# Patient Record
Sex: Female | Born: 1940 | Race: White | Hispanic: No | Marital: Married | Smoking: Never smoker
Health system: Southern US, Community
[De-identification: ages and names within clinical notes are randomized; demographics above are authoritative.]

## PROBLEM LIST (undated history)

## (undated) DIAGNOSIS — R609 Edema, unspecified: Secondary | ICD-10-CM

## (undated) HISTORY — PX: TONSILLECTOMY: SUR1361

## (undated) HISTORY — PX: TOTAL HIP ARTHROPLASTY: SHX124

## (undated) HISTORY — PX: JOINT REPLACEMENT: SHX530

## (undated) HISTORY — PX: TUBAL LIGATION: SHX77

## (undated) HISTORY — DX: Edema, unspecified: R60.9

---

## 1977-09-27 HISTORY — PX: DILATION AND CURETTAGE OF UTERUS: SHX78

## 2004-07-27 ENCOUNTER — Inpatient Hospital Stay (HOSPITAL_COMMUNITY): Admission: RE | Admit: 2004-07-27 | Discharge: 2004-07-30 | Payer: Self-pay | Admitting: Orthopedic Surgery

## 2011-11-12 ENCOUNTER — Observation Stay (HOSPITAL_COMMUNITY)
Admission: EM | Admit: 2011-11-12 | Discharge: 2011-11-13 | DRG: 249 | Disposition: A | Discharge status: Home / Self Care | Payer: BC Managed Care – PPO | Attending: Orthopaedic Surgery | Admitting: Orthopaedic Surgery

## 2011-11-12 ENCOUNTER — Emergency Department (HOSPITAL_COMMUNITY): Payer: BC Managed Care – PPO

## 2011-11-12 ENCOUNTER — Encounter (HOSPITAL_COMMUNITY): Payer: Self-pay | Admitting: Neurology

## 2011-11-12 ENCOUNTER — Other Ambulatory Visit: Payer: Self-pay

## 2011-11-12 ENCOUNTER — Emergency Department (HOSPITAL_COMMUNITY): Payer: BC Managed Care – PPO | Admitting: Anesthesiology

## 2011-11-12 ENCOUNTER — Encounter (HOSPITAL_COMMUNITY)
Admission: EM | Disposition: A | Discharge status: Home / Self Care | Payer: Self-pay | Source: Home / Self Care | Attending: Emergency Medicine

## 2011-11-12 ENCOUNTER — Encounter (HOSPITAL_COMMUNITY): Payer: Self-pay | Admitting: Anesthesiology

## 2011-11-12 DIAGNOSIS — X500XXA Overexertion from strenuous movement or load, initial encounter: Secondary | ICD-10-CM | POA: Insufficient documentation

## 2011-11-12 DIAGNOSIS — T84028A Dislocation of other internal joint prosthesis, initial encounter: Secondary | ICD-10-CM

## 2011-11-12 DIAGNOSIS — Z96649 Presence of unspecified artificial hip joint: Secondary | ICD-10-CM

## 2011-11-12 DIAGNOSIS — Y93E5 Activity, floor mopping and cleaning: Secondary | ICD-10-CM | POA: Insufficient documentation

## 2011-11-12 DIAGNOSIS — M25559 Pain in unspecified hip: Secondary | ICD-10-CM | POA: Insufficient documentation

## 2011-11-12 DIAGNOSIS — Y998 Other external cause status: Secondary | ICD-10-CM | POA: Insufficient documentation

## 2011-11-12 DIAGNOSIS — Y92009 Unspecified place in unspecified non-institutional (private) residence as the place of occurrence of the external cause: Secondary | ICD-10-CM | POA: Insufficient documentation

## 2011-11-12 DIAGNOSIS — T84029A Dislocation of unspecified internal joint prosthesis, initial encounter: Principal | ICD-10-CM | POA: Insufficient documentation

## 2011-11-12 HISTORY — PX: HIP CLOSED REDUCTION: SHX983

## 2011-11-12 LAB — BASIC METABOLIC PANEL
BUN: 9 mg/dL (ref 6–23)
CO2: 27 mEq/L (ref 19–32)
Calcium: 9.8 mg/dL (ref 8.4–10.5)
Chloride: 99 mEq/L (ref 96–112)
Creatinine, Ser: 0.7 mg/dL (ref 0.50–1.10)
GFR calc Af Amer: >90 mL/min (ref >90)
GFR calc non Af Amer: 87 mL/min — ABNORMAL LOW (ref >90)
Glucose, Bld: 122 mg/dL — ABNORMAL HIGH (ref 70–99)
Potassium: 3.6 mEq/L (ref 3.5–5.1)
Sodium: 133 mEq/L — ABNORMAL LOW (ref 135–145)

## 2011-11-12 LAB — CBC
HCT: 36.8 % (ref 36.0–46.0)
Hemoglobin: 12.6 g/dL (ref 12.0–15.0)
MCH: 31.8 pg (ref 26.0–34.0)
MCHC: 34.2 g/dL (ref 30.0–36.0)
MCV: 92.9 fL (ref 78.0–100.0)
Platelets: 166 10*3/uL (ref 150–400)
RBC: 3.96 MIL/uL (ref 3.87–5.11)
RDW: 11.8 % (ref 11.5–15.5)
WBC: 7.4 10*3/uL (ref 4.0–10.5)

## 2011-11-12 SURGERY — CLOSED REDUCTION, HIP
Anesthesia: General | Site: Hip | Laterality: Right | Wound class: Clean

## 2011-11-12 MED ORDER — FENTANYL CITRATE 0.05 MG/ML IJ SOLN
INTRAMUSCULAR | Status: DC | PRN
  Administered 2011-11-12: 25 ug via INTRAVENOUS

## 2011-11-12 MED ORDER — PROPOFOL BOLUS VIA INFUSION: 0.5000 mg/kg | Freq: Once | INTRAVENOUS | Status: DC

## 2011-11-12 MED ORDER — ONDANSETRON HCL 4 MG/2ML IJ SOLN
4.0000 mg | Freq: Once | INTRAMUSCULAR | Status: AC
  Administered 2011-11-12: 4 mg via INTRAVENOUS
  Filled 2011-11-12: qty 2

## 2011-11-12 MED ORDER — PROPOFOL 10 MG/ML IV BOLUS
INTRAVENOUS | Status: DC | PRN
  Administered 2011-11-12 (×2): 50 mg via INTRAVENOUS
  Administered 2011-11-12: 150 mg via INTRAVENOUS

## 2011-11-12 MED ORDER — PROPOFOL 10 MG/ML IV EMUL
5.0000 ug/kg/min | INTRAVENOUS | Status: DC
  Filled 2011-11-12: qty 20

## 2011-11-12 MED ORDER — FENTANYL CITRATE 0.05 MG/ML IJ SOLN
INTRAMUSCULAR | Status: DC | PRN
  Administered 2011-11-12: 100 ug via INTRAVENOUS

## 2011-11-12 MED ORDER — PROPOFOL 10 MG/ML IV EMUL
INTRAVENOUS | Status: DC | PRN
  Administered 2011-11-12 (×3): 5 mL via INTRAVENOUS

## 2011-11-12 MED ORDER — FENTANYL CITRATE 0.05 MG/ML IJ SOLN
50.0000 ug | Freq: Once | INTRAMUSCULAR | Status: AC
  Administered 2011-11-12: 50 ug via INTRAVENOUS
  Filled 2011-11-12: qty 2

## 2011-11-12 MED ORDER — SUCCINYLCHOLINE CHLORIDE 20 MG/ML IJ SOLN
INTRAMUSCULAR | Status: DC | PRN
  Administered 2011-11-12 (×4): 20 mg via INTRAVENOUS

## 2011-11-12 MED ORDER — MORPHINE SULFATE 4 MG/ML IJ SOLN: 4.0000 mg | Freq: Once | INTRAMUSCULAR | Status: DC

## 2011-11-12 MED ORDER — FENTANYL CITRATE 0.05 MG/ML IJ SOLN
100.0000 ug | Freq: Once | INTRAMUSCULAR | Status: AC
  Administered 2011-11-12: 25 ug via INTRAVENOUS
  Filled 2011-11-12: qty 2

## 2011-11-12 MED ORDER — LACTATED RINGERS IV SOLN
INTRAVENOUS | Status: DC | PRN
  Administered 2011-11-12: 23:00:00 via INTRAVENOUS

## 2011-11-12 MED ORDER — KETOROLAC TROMETHAMINE 15 MG/ML IJ SOLN
15.0000 mg | Freq: Four times a day (QID) | INTRAMUSCULAR | Status: DC
  Administered 2011-11-12: 15 mg via INTRAVENOUS
  Filled 2011-11-12 (×3): qty 1

## 2011-11-12 SURGICAL SUPPLY — 1 items: IMMOBILIZER KNEE 22 UNIV (SOFTGOODS) ×1 IMPLANT

## 2011-11-12 NOTE — Transfer of Care (Signed)
Immediate Anesthesia Transfer of Care Note  Patient: Patricia [name]  Procedure(s) Performed: Procedure(s) (LRB): CLOSED REDUCTION HIP (Right)  Patient Location: PACU  Anesthesia Type: General  Level of Consciousness: awake  Airway & Oxygen Therapy: Patient Spontanous Breathing  Post-op Assessment: Report given to PACU RN and Post -op Vital signs reviewed and stable  Post vital signs: Reviewed and stable  Complications: No apparent anesthesia complications

## 2011-11-12 NOTE — ED Notes (Signed)
Pt transported to OR, report given to OR.  Transported with ortho MD.  Husband took belongings with.

## 2011-11-12 NOTE — Op Note (Signed)
PATIENT ID:      Patricia [name]  MRN:     [id number] DOB/AGE:    1941-06-04 / 71 y.o.       OPERATIVE REPORT    DATE OF PROCEDURE:  11/12/2011       PREOPERATIVE DIAGNOSIS:   Dislocated right hip prosthesis                                                       There is no height on file to calculate BMI.     POSTOPERATIVE DIAGNOSIS:   Dislocated Right Hip Prosthesis                                                                     There is no height on file to calculate BMI.     PROCEDURE:  Procedure(s): CLOSED REDUCTION HIP     SURGEON: Kashon Kraynak [name]    ASSISTANT:   Jacqualine Code, PA-C   (Present and scrubbed throughout the case, critical for assistance with exposure, retraction, instrumentation, and closure.)          ANESTHESIA: General      DRAINS: none :      TOURNIQUET TIME: * No tourniquets in log *    COMPLICATIONS:  None   CONDITION:  stable  PROCEDURE IN DETAIL: [id number]   Patricia [name] 11/12/2011, 11:30 PM

## 2011-11-12 NOTE — Anesthesia Postprocedure Evaluation (Signed)
  Anesthesia Post-op Note  Patient: Patricia [name]  Procedure(s) Performed: Procedure(s) (LRB): CLOSED REDUCTION HIP (Right)  Patient Location: PACU  Anesthesia Type: General  Level of Consciousness: awake  Airway and Oxygen Therapy: Patient Spontanous Breathing  Post-op Pain: none  Post-op Assessment: Post-op Vital signs reviewed, Patient's Cardiovascular Status Stable, Respiratory Function Stable, Patent Airway, No signs of Nausea or vomiting and Pain level controlled  Post-op Vital Signs: stable  Complications: No apparent anesthesia complications

## 2011-11-12 NOTE — ED Notes (Signed)
Xray, phlebotomy at bedside.  12 lead EKG being done as well.

## 2011-11-12 NOTE — ED Notes (Signed)
Xray at bedside for portable right hip xray

## 2011-11-12 NOTE — ED Notes (Signed)
[id number] Ready 

## 2011-11-12 NOTE — ED Notes (Signed)
MD at bedside. 

## 2011-11-12 NOTE — Op Note (Deleted)
ERROR

## 2011-11-12 NOTE — ED Provider Notes (Signed)
History     CSN: [id number]  Arrival date & time 11/12/11  7829   First MD Initiated Contact with Patient 11/12/11 1852      Chief Complaint  Patient presents with  . Hip Pain    (Consider location/radiation/quality/duration/timing/severity/associated sxs/prior treatment) Patient is a 71 y.o. female presenting with hip pain.  Hip Pain Associated symptoms include arthralgias. Pertinent negatives include no numbness, rash or weakness.   History is obtained from the patient. She is status post bilateral total hip arthroplasty, which she believes was performed over 10 years ago. Her orthopedist is Dr. Thurston Hole. She states that she has done well with her replacements and has not had any issues. Today, she was at home cleaning around 1700 when she bent over and felt her right hip "pop out of place." She has noticed a deformity to the area. Denies any distal numbness or weakness. States her pain worsens when she attempts to move.  History reviewed. No pertinent past medical history.  Past Surgical History  Procedure Date  . Total hip arthroplasty     No family history on file.  History  Substance Use Topics  . Smoking status: Never Smoker   . Smokeless tobacco: Not on file  . Alcohol Use: Yes    OB History    Grav Para Term Preterm Abortions TAB SAB Ect Mult Living                  Review of Systems  Constitutional: Negative.   Musculoskeletal: Positive for arthralgias and gait problem.  Skin: Negative for color change and rash.  Neurological: Negative for weakness and numbness.    Allergies  Review of patient's allergies indicates no known allergies.  Home Medications   Current Outpatient Rx  Name Route Sig Dispense Refill  . IBUPROFEN 200 MG PO TABS Oral Take 600 mg by mouth every 6 (six) hours as needed. For fever      BP 165/86  Pulse 88  Temp(Src) 98.4 F (36.9 C) (Oral)  Resp 16  SpO2 100%  Physical Exam  Vitals reviewed. Constitutional: She is  oriented to person, place, and time. She appears well-developed and well-nourished. No distress.  HENT:  Head: Normocephalic and atraumatic.  Neck: Normal range of motion.  Cardiovascular: Normal rate.   Pulmonary/Chest: Effort normal.  Musculoskeletal:       Right hip: She exhibits decreased range of motion, bony tenderness and deformity.       Obvious deformity at right hip. The leg is shortened and rotated. She has limited movement in the leg. The extremity is neurovascularly intact with sensory intact to light touch. Dorsalis pedis and posterior tibial pulses intact. Capillary refill less than 2 seconds.  Neurological: She is alert and oriented to person, place, and time.  Skin: Skin is warm and dry. She is not diaphoretic.  Psychiatric: She has a normal mood and affect.    ED Course  Procedures (including critical care time)  Labs Reviewed - No data to display Dg Hip Complete Right  11/12/2011  *RADIOLOGY REPORT*  Clinical Data: Status post hip arthroplasty.  Question dislocated hip.  RIGHT HIP - COMPLETE 2+ VIEW  Comparison: None.  Findings: Superior and posterior dislocation of the femoral component of the right hip arthroplasty.  The left hip arthroplasty is located.  Mild osteopenia.  No periprosthetic fracture.  IMPRESSION: Status post right hip arthroplasty with superior posterior dislocation of the femoral component.  Original Report Authenticated By: Consuello Bossier, M.D.  I personally reviewed the pt's films.   1. Dislocation of hip prosthesis       MDM  Discussed with Dr. Weldon Inches. Hip is superiorly posteriorly dislocated on plain films. Dr. Weldon Inches discussed with Dr. Cleophas Dunker, who came in to re-locate the hip under procedural sedation. She tolerated this well but the hip was unsuccessfully reduced. She will be taken to the OR for re-location of the prosthesis.        Grant Fontana, [place] 11/12/11 2236

## 2011-11-12 NOTE — Anesthesia Preprocedure Evaluation (Addendum)
Anesthesia Evaluation  Patient identified by MRN, date of birth, ID band Patient awake    Reviewed: Allergy & Precautions, H&P , NPO status , Patient's Chart, lab work & pertinent test results  Airway Mallampati: I TM Distance: >3 FB Neck ROM: Full    Dental   Pulmonary    Pulmonary exam normal       Cardiovascular     Neuro/Psych    GI/Hepatic   Endo/Other    Renal/GU      Musculoskeletal   Abdominal   Peds  Hematology   Anesthesia Other Findings   Reproductive/Obstetrics                           Anesthesia Physical Anesthesia Plan  ASA: I and Emergent  Anesthesia Plan: General   Post-op Pain Management:    Induction: Intravenous  Airway Management Planned: Mask  Additional Equipment:   Intra-op Plan:   Post-operative Plan: Extubation in OR  Informed Consent: I have reviewed the patients History and Physical, chart, labs and discussed the procedure including the risks, benefits and alternatives for the proposed anesthesia with the patient or authorized representative who has indicated his/her understanding and acceptance.     Plan Discussed with: CRNA and Surgeon  Anesthesia Plan Comments:        Anesthesia Quick Evaluation

## 2011-11-12 NOTE — H&P (Signed)
Patricia [name] MRN:  [id number] DOB/SEX:  Apr 10, 1941/female     HISTORY AND PHYSICAL   CHIEF COMPLAINT:  Painful right Hip  HISTORY: Patricia [name] a 71 y.o. female with moderate aching, sharp and throbbing groin right hip pain for last several hours..  Symptoms include inability to walk, groin pain and trouble weight bearing.  She had been dusting earlier today when she bent over and the 71 y/o Right THR dislocated.  She was unable to ambulate and was brought to the emergency room at [facility] emergency room where x-rays confirmed a dislocation of the right hip.  Attempt at a closed reduction failed to restore an anatomical position.  She is now admitted for closed reduction in the operating room.  PAST MEDICAL HISTORY:   D&C, and bilateral Total Hip Replacement with subsequent dislocation of right hip immediately after the surgery 16 years ago  Past Surgical History  Procedure Date  . Total hip arthroplasty      MEDICATIONS:  none  ALLERGIES:  No Known Allergies  REVIEW OF SYSTEMS:  A comprehensive review of systems was negative.  FAMILY HISTORY:  No family history on file.  SOCIAL HISTORY:   History  Substance Use Topics  . Smoking status: Never Smoker   . Smokeless tobacco: Not on file  . Alcohol Use: Yes     EXAMINATION:  Vital signs in last 24 hours: Temp:  [98.4 F (36.9 C)] 98.4 F (36.9 C) (02/15 1844) Pulse Rate:  [62-88] 77  (02/15 2200) Resp:  [12-30] 18  (02/15 2200) BP: (136-180)/(67-107) 159/80 mmHg (02/15 2200) SpO2:  [98 %-100 %] 100 % (02/15 2200) Weight:  [74.844 kg (165 lb)] 74.844 kg (165 lb) (02/15 2108)  BP 157/90  Pulse 82  Temp(Src) 98.4 F (36.9 C) (Oral)  Resp 18  Wt 74.844 kg (165 lb)  SpO2 98%  General Appearance:    Alert, cooperative, no distress, appears stated age  Head:    Normocephalic, without obvious abnormality, atraumatic  Eyes:    PERRL, conjunctiva/corneas clear, EOM's intact         Ears:    Normal TM's and  external ear canals, both ears  Nose:   Nares normal, septum midline, mucosa normal, no drainage    or sinus tenderness  Throat:   Lips, mucosa, and tongue normal; teeth and gums normal  Neck:   Supple, symmetrical, trachea midline, no carotid   bruit      Lungs:     Clear to auscultation bilaterally, respirations unlabored  Chest wall:    No tenderness or deformity  Heart:    Regular rate and rhythm, S1 and S2 normal, no murmur, rub   or gallop  Abdomen:     Soft, non-tender, bowel sounds active all four quadrants,    no masses, no organomegaly        Extremities:   Sensation intact.  Painful ROM  Pulses:   2+ and symmetric all extremities  Skin:   Skin color, texture, turgor normal, no rashes or lesions     Neurologic:   CNII-XII intact. Normal strength, sensation          Assessment/Plan: Dislocation , right Total Hip Replacement  Plan for Closed reduction Right total hip replacement   The procedure,  risks, and benefits of total hip arthroplasty were presented and reviewed. The risks including but not limited to aseptic loosening, infection, blood clots, vascular injury, stiffness problems, as well as fracture complications among others were discussed.  The patient acknowledged the explanation, agreed to proceed with the plan.  Patricia [name] 11/12/2011, 10:11 PM

## 2011-11-12 NOTE — ED Provider Notes (Signed)
Medical screening examination/treatment/procedure(s) were conducted as a shared visit with non-physician practitioner(s) and myself.  I personally evaluated the patient during the encounter  Tiandre Teall P Dorothy Polhemus, MD 11/12/11 2359 

## 2011-11-12 NOTE — Anesthesia Procedure Notes (Signed)
Procedure Name: LMA Insertion Date/Time: 11/12/2011 11:03 PM Performed by: Alanda Amass Pre-anesthesia Checklist: Patient identified, Timeout performed, Emergency Drugs available, Suction available and Patient being monitored Patient Re-evaluated:Patient Re-evaluated prior to inductionOxygen Delivery Method: Circle System Utilized Preoxygenation: Pre-oxygenation with 100% oxygen Intubation Type: IV induction Ventilation: Mask ventilation without difficulty LMA: LMA inserted LMA Size: 4.0 Number of attempts: 1 Tube secured with: Tape Dental Injury: Teeth and Oropharynx as per pre-operative assessment

## 2011-11-12 NOTE — ED Notes (Signed)
Per ems- pt cleaning today, bent over and felt a pop in her right hip. Hx of bilateral hip placement. Pt given 8 mg morphine. 20 gauge in r. Forearm. Vitals 120/82, 78, 97% RA. Pain 3/10 when not moving. Shortening and rotation noted in right leg. Alert and oriented.

## 2011-11-12 NOTE — ED Provider Notes (Addendum)
Medical screening examination/treatment/procedure(s) were conducted as a shared visit with non-physician practitioner(s) and myself.  I personally evaluated the patient during the encounter 71 year old, female, with bilateral prosthetic hips, presents to emergency department with right hip pain.  She was bending over to dust and her hip rolled, and she felt immediate pain.  She presents to emergency department with a shortened and internally rotated right hip.  Her last meal was 11:00 this morning.  She has no other complaints.  X-ray shows prosthetic hip dislocation.  We will consult her orthopedist for relocation  Nicholes Stairs, MD 11/12/11 2046  I spoke with the orthopedist.  Dr. Cleophas Dunker.  He will come in to reduce the hip.  We will attempt to do this with procedural sedation using propofol and fentanyl.    Nicholes Stairs, MD 11/12/11 2103  Perform procedural sedation with Dr. Cleophas Dunker is sedation went well.  The patient tolerated it without any complications.  However, they were not able to reduce her right hip.  Therefore, the patient will be taken to the operating room for reduction of her prosthetic right hip.  preop ecg ED ECG REPORT   Date: 11/12/2011  EKG Time: 10:34 PM  Rate: 82  Rhythm: normal sinus rhythm,    Axis: nl  Intervals:none  ST&T Change: none  Narrative Interpretation: nsr            Nicholes Stairs, MD 11/12/11 2236

## 2011-11-12 NOTE — ED Notes (Signed)
Pt sedated and monitored by Dr. Weldon Inches and hip maneuvered by Dr. Cleophas Dunker.  Consent signed before procedure.  Vital signs were stable during sedation.  Pt now awake, alert and oriented post-procedure.  Waiting for post-reduction x-ray.

## 2011-11-13 MED ORDER — HYDROMORPHONE HCL PF 1 MG/ML IJ SOLN: 0.5000 mg | INTRAMUSCULAR | Status: DC | PRN

## 2011-11-13 MED ORDER — METHOCARBAMOL 100 MG/ML IJ SOLN
500.0000 mg | Freq: Four times a day (QID) | INTRAVENOUS | Status: DC | PRN
  Filled 2011-11-13: qty 5

## 2011-11-13 MED ORDER — HYDROMORPHONE HCL PF 1 MG/ML IJ SOLN: 0.2500 mg | INTRAMUSCULAR | Status: DC | PRN

## 2011-11-13 MED ORDER — MENTHOL 3 MG MT LOZG: 1.0000 | LOZENGE | OROMUCOSAL | Status: DC | PRN

## 2011-11-13 MED ORDER — METOCLOPRAMIDE HCL 5 MG/ML IJ SOLN
5.0000 mg | Freq: Three times a day (TID) | INTRAMUSCULAR | Status: DC | PRN
  Filled 2011-11-13: qty 2

## 2011-11-13 MED ORDER — SODIUM CHLORIDE 0.9 % IV SOLN: INTRAVENOUS | Status: DC

## 2011-11-13 MED ORDER — LORAZEPAM 2 MG/ML IJ SOLN: 1.0000 mg | Freq: Once | INTRAMUSCULAR | Status: DC | PRN

## 2011-11-13 MED ORDER — ONDANSETRON HCL 4 MG PO TABS: 4.0000 mg | ORAL_TABLET | Freq: Four times a day (QID) | ORAL | Status: DC | PRN

## 2011-11-13 MED ORDER — METHOCARBAMOL 500 MG PO TABS: 500.0000 mg | ORAL_TABLET | Freq: Four times a day (QID) | ORAL | Status: DC | PRN

## 2011-11-13 MED ORDER — ONDANSETRON HCL 4 MG/2ML IJ SOLN: 4.0000 mg | Freq: Four times a day (QID) | INTRAMUSCULAR | Status: DC | PRN

## 2011-11-13 MED ORDER — METOCLOPRAMIDE HCL 10 MG PO TABS: 5.0000 mg | ORAL_TABLET | Freq: Three times a day (TID) | ORAL | Status: DC | PRN

## 2011-11-13 MED ORDER — PHENOL 1.4 % MT LIQD: 1.0000 | OROMUCOSAL | Status: DC | PRN

## 2011-11-13 MED ORDER — OXYCODONE HCL 5 MG PO TABS: 5.0000 mg | ORAL_TABLET | ORAL | Status: DC | PRN

## 2011-11-13 MED ORDER — ALUM & MAG HYDROXIDE-SIMETH 200-200-20 MG/5ML PO SUSP: 30.0000 mL | ORAL | Status: DC | PRN

## 2011-11-13 MED ORDER — PROMETHAZINE HCL 25 MG/ML IJ SOLN: 6.2500 mg | INTRAMUSCULAR | Status: DC | PRN

## 2011-11-13 NOTE — Discharge Summary (Signed)
PATIENT ID:      Patricia [name]  MRN:     [id number] DOB/AGE:    [birth year] / 69 y.o.     DISCHARGE SUMMARY  ADMISSION DATE:    11/12/2011 DISCHARGE DATE:   11/13/2011   ADMISSION DIAGNOSIS: Dislocation of hip prosthesis [996.42, V43.64] cp  (Dislocated right hip prosthesis)  DISCHARGE DIAGNOSIS:  Dislocated right hip prosthesis    ADDITIONAL DIAGNOSIS: Principal Problem:  *Failed total hip arthroplasty with dislocation  History reviewed. No pertinent past medical history.  PROCEDURE: Procedure(s): CLOSED REDUCTION Right  HIP on 11/12/2011 - 11/13/2011  CONSULTS:   none  HISTORY: Patricia [name] a 71 y.o. female with moderate aching, sharp and throbbing groin right hip pain for last several hours.. Symptoms include inability to walk, groin pain and trouble weight bearing. She had been dusting earlier today when she bent over and the 71 y/o Right THR dislocated. She was unable to ambulate and was brought to the emergency room at [facility] emergency room where x-rays confirmed a dislocation of the right hip. Attempt at a closed reduction failed to restore an anatomical position. She is now admitted for closed reduction in the operating room.   HOSPITAL COURSE:  Patricia [name] is a 71 y.o. admitted on 11/12/2011 and found to have a diagnosis of Dislocated right hip prosthesis.  After appropriate laboratory studies were obtained  they were taken to the operating room on 11/12/2011 - 11/13/2011 and underwent Procedure(s): CLOSED REDUCTION RIGHT TOTAL HIP.   They were given perioperative antibiotics:  Anti-infectives    None    . Blood products given:none  Had an unremarkable hospital course.  Has ambulated to bathroom  Without problems. The remainder of the hospital course was dedicated to ambulation and strengthening.   The patient was discharged on 1 Day Post-Op in  Good condition.   DIAGNOSTIC STUDIES: Recent vital signs: Patient Vitals for the past 24 hrs:  BP Temp Temp src Pulse  Resp SpO2 Weight  11/13/11 0700 123/71 mmHg 98 F (36.7 C) - 67  16  91 % -  11/13/11 0015 149/77 mmHg 98.3 F (36.8 C) - 83  16  95 % -  11/12/11 2351 130/64 mmHg - - 85  16  95 % -  11/12/11 2345 - - - 87  23  95 % -  11/12/11 2337 - - - 93  22  97 % -  11/12/11 2336 144/68 mmHg - - - - - -  11/12/11 2334 - 97.7 F (36.5 C) - 84  18  100 % -  11/12/11 2215 157/90 mmHg - - 82  18  98 % -  11/12/11 2200 159/80 mmHg - - 77  18  100 % -  11/12/11 2145 169/85 mmHg - - 69  19  99 % -  11/12/11 2143 172/85 mmHg - - 73  30  99 % -  11/12/11 2139 136/107 mmHg - - 64  19  100 % -  11/12/11 2132 145/67 mmHg - - 62  12  100 % -  11/12/11 2108 - - - 76  16  99 % 74.844 kg (165 lb)  11/12/11 2107 150/72 mmHg - - 69  13  99 % -  11/12/11 1930 163/71 mmHg - - 70  - 98 % -  11/12/11 1850 165/86 mmHg - - - - - -  11/12/11 1844 180/88 mmHg 98.4 F (36.9 C) Oral 88  16  100 % -  Recent laboratory studies:  [facility] 11/12/11 2220  WBC 7.4  HGB 12.6  HCT 36.8  PLT 166    Basename 11/12/11 2220  NA 133*  K 3.6  CL 99  CO2 27  BUN 9  CREATININE 0.70  GLUCOSE 122*  CALCIUM 9.8   No results found for this basename: INR, PROTIME     Recent Radiographic Studies :  Dg Hip Complete Right  11/13/2011  *RADIOLOGY REPORT*  Clinical Data: Right hip dislocation  RIGHT HIP - COMPLETE 2+ VIEW  Comparison: 11/12/2011  Findings: Two intraoperative fluoroscopic images demonstrate interval reduction of the right hip dislocation.  No appreciable evidence for hardware complication or periprosthetic fracture no the inferior aspect of the femoral stem is not imaged.  IMPRESSION: Interval reduction of the right hip dislocation.  Original Report Authenticated By: Waneta Martins, M.D.   Dg Hip Complete Right  11/12/2011  *RADIOLOGY REPORT*  Clinical Data: Status post hip arthroplasty.  Question dislocated hip.  RIGHT HIP - COMPLETE 2+ VIEW  Comparison: None.  Findings: Superior and posterior  dislocation of the femoral component of the right hip arthroplasty.  The left hip arthroplasty is located.  Mild osteopenia.  No periprosthetic fracture.  IMPRESSION: Status post right hip arthroplasty with superior posterior dislocation of the femoral component.  Original Report Authenticated By: Consuello Bossier, M.D.   Dg Chest Portable 1 View  11/12/2011  *RADIOLOGY REPORT*  Clinical Data: Preop evaluation and right hip dislocation.  PORTABLE CHEST - 1 VIEW  Comparison: None.  Findings: Single view of the chest demonstrates clear lungs.  Heart and mediastinum are within normal limits and the trachea is midline.  No evidence for pneumothorax.  IMPRESSION: No acute chest findings.  Original Report Authenticated By: Richarda Overlie, M.D.   Dg Hip Portable 1 View Right  11/12/2011  *RADIOLOGY REPORT*  Clinical Data: Status post attempted reduction of right hip dislocation.  PORTABLE RIGHT HIP - 1 VIEW  Comparison: Right hip radiographs performed earlier today at 08:10 p.m.  Findings: The dislocation of the right hip prosthesis has shifted since the prior study, with medial dislocation now noted.  No fractures are seen.  There is no evidence of loosening.  Visualized soft tissues are grossly unremarkable in appearance, though not well assessed on radiograph.  IMPRESSION: Medial dislocation of the right hip prosthesis now noted.  No fractures seen.  Findings were discussed with Dr. Cheri Guppy at 10:44 p.m. on 11/12/2011.  Original Report Authenticated By: Tonia Ghent, M.D.    DISCHARGE INSTRUCTIONS: Discharge Orders    Future Orders Please Complete By Expires   Diet general      Call MD / Call 911      Comments:   If you experience chest pain or shortness of breath, CALL 911 and be transported to the hospital emergency room.  If you develope a fever above 101 F, pus (white drainage) or increased drainage or redness at the wound, or calf pain, call your surgeon's office.   Constipation Prevention       Comments:   Drink plenty of fluids.  Prune juice may be helpful.  You may use a stool softener, such as Colace (over the counter) 100 mg twice a day.  Use MiraLax (over the counter) for constipation as needed.   Increase activity slowly as tolerated      Weight Bearing as taught in Physical Therapy      Comments:   Use a walker or crutches as instructed in Physical  Therapy (PT) weight bearing as tolerated.   Driving restrictions      Comments:   No driving for 6 weeks   Lifting restrictions      Comments:   No lifting for 6 weeks   Discharge instructions      Comments:   Follow the hip precaution rules as taught previously.  Sit in high chairs.  Abduction pillow when in bed.  Knee immobilizer when out of bed.  Use ibuprofen for pain.      DISCHARGE MEDICATIONS:   Medication List  As of 11/13/2011  9:57 AM   TAKE these medications         ibuprofen 200 MG tablet   Commonly known as: ADVIL,MOTRIN   Take 600 mg by mouth every 6 (six) hours as needed. For fever            FOLLOW UP VISIT:   Follow-up Information    Schedule an appointment as soon as possible for a visit with Nilda Simmer, MD. (for next week)    Contact information:   [facility] [address] [place] [address] [phone]          DISPOSITION:  Home    CONDITION:  Spero Curb 11/13/2011, 9:57 AM

## 2011-11-13 NOTE — Progress Notes (Signed)
PATIENT ID:      Patricia [name]  MRN:     [id number] DOB/AGE:    28-Apr-1941 / 71 y.o.    PROGRESS NOTE Subjective:  negative for Chest Pain  negative for Shortness of Breath  negative for Nausea/Vomiting   negative for Calf Pain  negative for Bowel Movement   Tolerating Diet: yes         Patient reports pain as 0 on 0-10 scale.    Objective: Vital signs in last 24 hours:   Patient Vitals for the past 24 hrs:  BP Temp Temp src Pulse Resp SpO2 Weight  11/13/11 0700 123/71 mmHg 98 F (36.7 C) - 67  16  91 % -  11/13/11 0015 149/77 mmHg 98.3 F (36.8 C) - 83  16  95 % -  11/12/11 2351 130/64 mmHg - - 85  16  95 % -  11/12/11 2345 - - - 87  23  95 % -  11/12/11 2337 - - - 93  22  97 % -  11/12/11 2336 144/68 mmHg - - - - - -  11/12/11 2334 - 97.7 F (36.5 C) - 84  18  100 % -  11/12/11 2215 157/90 mmHg - - 82  18  98 % -  11/12/11 2200 159/80 mmHg - - 77  18  100 % -  11/12/11 2145 169/85 mmHg - - 69  19  99 % -  11/12/11 2143 172/85 mmHg - - 73  30  99 % -  11/12/11 2139 136/107 mmHg - - 64  19  100 % -  11/12/11 2132 145/67 mmHg - - 62  12  100 % -  11/12/11 2108 - - - 76  16  99 % 74.844 kg (165 lb)  11/12/11 2107 150/72 mmHg - - 69  13  99 % -  11/12/11 1930 163/71 mmHg - - 70  - 98 % -  11/12/11 1850 165/86 mmHg - - - - - -  11/12/11 1844 180/88 mmHg 98.4 F (36.9 C) Oral 88  16  100 % -    @flow {1959:LAST@   Intake/Output from previous day:   02/15 0701 - 02/16 0700 In: 2080 [P.O.:480; I.V.:1600] Out: 700 [Urine:700]   Intake/Output this shift:       Intake/Output      02/15 0701 - 02/16 0700 02/16 0701 - 02/17 0700   P.O. 480    I.V. (mL/kg) 1600 (21.4)    Total Intake(mL/kg) 2080 (27.8)    Urine (mL/kg/hr) 700 (0.4)    Total Output 700    Net +1380         Urine Occurrence 2 x       LABORATORY DATA:  Basename 11/12/11 2220  WBC 7.4  HGB 12.6  HCT 36.8  PLT 166    Basename 11/12/11 2220  NA 133*  K 3.6  CL 99  CO2 27  BUN 9  CREATININE  0.70  GLUCOSE 122*  CALCIUM 9.8   No results found for this basename: INR, PROTIME    Examination:  General appearance: alert, cooperative and no distress Extremities: extremities normal, atraumatic, no cyanosis or edema  Wound Exam: none   Drainage:  None  Motor Exam: EHL, FHL, Anterior Tibial and Posterior Tibial Intact  Sensory Exam: Superficial Peroneal, Deep Peroneal and Tibial normal  Vascular Exam: DP intact    Assessment:    1 Day Post-Op  Procedure(s) (LRB): CLOSED REDUCTION HIP (Right)  ADDITIONAL  DIAGNOSIS:  Principal Problem:  *Failed total hip arthroplasty with dislocation     Plan: Physical Therapy as ordered Weight Bearing as Tolerated (WBAT)  DVT Prophylaxis:  None  DISCHARGE PLAN: Home  DISCHARGE NEEDS: equipment at home already  D/C today to home F/U with Dr Thurston Hole next week Keep Knee immobilizer on and follow 90/90 rules (posterior precaution)        Dayvian Blixt 11/13/2011, 9:48 AM

## 2011-11-13 NOTE — Progress Notes (Signed)
Physical Therapy Evaluation Patient Details Name: Patricia [name] MRN: [id number] DOB: 11-07-1940 Today's Date: 11/13/2011  Problem List:  Patient Active Problem List  Diagnoses  . Failed total hip arthroplasty with dislocation    Past Medical History: History reviewed. No pertinent past medical history. Past Surgical History:  Past Surgical History  Procedure Date  . Total hip arthroplasty     PT Assessment/Plan/Recommendation PT Assessment Clinical Impression Statement: Pt is a 71 y/o female admitted s/p closed reduction of right hip dislocation.  Pt is modified independent with all mobility except for stairs (requiring supervision for verbal cues only).  Pt is ready for safe d/c home without PT f/u acutely or at discharge. PT Recommendation/Assessment: Patent does not need any further PT services No Skilled PT: All education completed;Patient will have necessary level of assist by caregiver at discharge;Patient is modified independent with all activity/mobility (Supervision only required for stairs.) PT Recommendation Follow Up Recommendations: No PT follow up Equipment Recommended: None recommended by PT  PT Evaluation Precautions/Restrictions  Precautions Precautions: Posterior Hip (Right LE.) Precaution Booklet Issued: Yes (comment) (Posterior Hip Precaution sheet given on evaluation.) Precaution Comments: Pt able to recall 2/3 posterior hip precautions prior to education on 3/3. Required Braces or Orthoses: Yes Knee Immobilizer: On at all times (Right LE in bed.) Restrictions Weight Bearing Restrictions: Yes RLE Weight Bearing: Weight bearing as tolerated Prior Functioning  Home Living Lives With: Spouse Receives Help From: Family Type of Home: House Home Layout: Two level Alternate Level Stairs-Rails: Right Alternate Level Stairs-Number of Steps: 14 Home Access: Stairs to enter Entrance Stairs-Rails: None Entrance Stairs-Number of Steps: 2 Home Adaptive  Equipment: Walker - rolling Prior Function Level of Independence: Independent with basic ADLs;Independent with homemaking with ambulation;Independent with gait;Independent with transfers Able to Take Stairs?: Reciprically Driving: Yes Vocation: Full time employment Vocation Requirements: Engineer, site. Cognition Cognition Arousal/Alertness: Awake/alert Overall Cognitive Status: Appears within functional limits for tasks assessed Orientation Level: Oriented X4 Sensation/Coordination Sensation Light Touch: Appears Intact Stereognosis: Not tested Hot/Cold: Not tested Proprioception: Not tested Coordination Gross Motor Movements are Fluid and Coordinated: Yes Fine Motor Movements are Fluid and Coordinated: Yes Extremity Assessment RUE Assessment RUE Assessment: Within Functional Limits LUE Assessment LUE Assessment: Within Functional Limits RLE Assessment RLE Assessment: Within Functional Limits LLE Assessment LLE Assessment: Within Functional Limits Mobility (including Balance) Bed Mobility Bed Mobility: Yes Supine to Sit: 6: Modified independent (Device/Increase time) Transfers Transfers: Yes Sit to Stand: 6: Modified independent (Device/Increase time) Stand to Sit: 6: Modified independent (Device/Increase time) Ambulation/Gait Ambulation/Gait: Yes Ambulation/Gait Assistance: 6: Modified independent (Device/Increase time) Ambulation Distance (Feet): 200 Feet Assistive device: Rolling walker Gait Pattern: Step-to pattern;Decreased step length - right;Decreased stance time - right Stairs: Yes Stairs Assistance: 5: Supervision Stairs Assistance Details (indicate cue type and reason): Verbal cues for sequence using "up with good, down with bad." Stair Management Technique: One rail Right;Step to pattern;Forwards Number of Stairs: 2  Height of Stairs: 8  (inches.) Wheelchair Mobility Wheelchair Mobility: No  Posture/Postural Control Posture/Postural Control: No  significant limitations Balance Balance Assessed: No Exercise  Total Joint Exercises Ankle Circles/Pumps: AROM;Right;10 reps;Supine Quad Sets: AROM;Right;10 reps;Supine Heel Slides: AROM;Right;10 reps;Supine Hip ABduction/ADduction: AROM;Right;5 reps;Standing Knee Flexion: AROM;Right;5 reps;Standing Marching in Standing: AROM;Right;5 reps;Standing Standing Hip Extension: AROM;Right;5 reps;Standing End of Session PT - End of Session Equipment Utilized During Treatment: Gait belt Activity Tolerance: Patient tolerated treatment well Patient left: in chair;with call bell in reach;with family/visitor present Nurse Communication: Mobility status for transfers;Mobility status for ambulation  General Behavior During Session: [facility] for tasks performed Cognition: [facility] for tasks performed  Cephus Shelling 11/13/2011, 12:32 PM  11/13/2011 Cephus Shelling, PT, DPT [phone]

## 2011-11-13 NOTE — Op Note (Signed)
[name], [name] NO.:  [id number]  MEDICAL RECORD NO.:  [id number]  LOCATION:  5029                         FACILITY:  [facility]  PHYSICIAN:  Claude Manges. Jarrad Mclees, M.D.DATE OF BIRTH:  01/05/1941  DATE OF PROCEDURE:  11/12/2011 DATE OF DISCHARGE:                              OPERATIVE REPORT   PREOPERATIVE DIAGNOSIS:  Posterior superior dislocation of right total hip replacement.  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  PROCEDURE:  Closed reduction of dislocated right total hip prosthesis.  SURGEON:  Claude Manges. Cleophas Dunker, MD  ASSISTANT:  Oris Drone. Petrarca, PA-C  ANESTHESIA:  General laryngeal.  COMPLICATIONS:  None.  HISTORY:  A 71 year old female was approximately 16 years status post insertion of a right total hip replacement.  She has had no trouble with her hip since that time.  This evening, she was performing a simple activity at home when she experienced acute onset of pain.  She did not have a fall or other significant injury.  She was brought to the [facility] Emergency Room with x-rays consistent with a dislocation of the hip prosthesis in the posterior superior position.  We initially tried to reduce this under propofol in the emergency room, but we were unable to obtain adequate relaxation, so she is now to have closed manipulation in the operating room under C-arm guidance.  PROCEDURE:  [name] was seen by Anesthesia in the holding area.  We identified the right side as the appropriate operative site.  She has had a prior left total hip replacement that was asymptomatic.  The patient was then transported to room #4 and placed under general laryngeal anesthesia on the operating stretcher and then carefully transported to the operating room table.  With C-arm guidance, we manipulated the fracture.  We had some difficulty obtaining reduction, but with hyperflexion with traction, we were able to  relocate the prosthesis.  There did not appear to be a problem with the acetabular component or the femoral component in terms of motion or evidence of loosening.  It did not appear to be a problem with the polyethylene bearing and the hip appeared to be stable.  A knee immobilizer was then applied.  We will obtain an abduction brace in the morning.  We will plan to keep her overnight to be sure she is comfortable and discharged in the morning.  The patient did tolerate the procedure without complications.     Claude Manges. Cleophas Dunker, M.D.     PWW/MEDQ  D:  11/12/2011  T:  11/13/2011  Job:  [id number]

## 2011-11-15 ENCOUNTER — Encounter (HOSPITAL_COMMUNITY): Payer: Self-pay | Admitting: Orthopaedic Surgery

## 2011-11-15 NOTE — Progress Notes (Signed)
Utilization review completed. Marycatherine Maniscalco, RN, BSN.  11/15/11  

## 2012-03-28 ENCOUNTER — Emergency Department (HOSPITAL_COMMUNITY): Payer: BC Managed Care – PPO

## 2012-03-28 ENCOUNTER — Emergency Department (HOSPITAL_COMMUNITY)
Admission: EM | Admit: 2012-03-28 | Discharge: 2012-03-29 | Disposition: A | Discharge status: Home / Self Care | Payer: BC Managed Care – PPO | Attending: Orthopedic Surgery | Admitting: Orthopedic Surgery

## 2012-03-28 DIAGNOSIS — W19XXXA Unspecified fall, initial encounter: Secondary | ICD-10-CM | POA: Insufficient documentation

## 2012-03-28 DIAGNOSIS — S73006A Unspecified dislocation of unspecified hip, initial encounter: Secondary | ICD-10-CM

## 2012-03-28 DIAGNOSIS — T84029A Dislocation of unspecified internal joint prosthesis, initial encounter: Secondary | ICD-10-CM | POA: Insufficient documentation

## 2012-03-28 DIAGNOSIS — Z96649 Presence of unspecified artificial hip joint: Secondary | ICD-10-CM | POA: Insufficient documentation

## 2012-03-28 DIAGNOSIS — T84028A Dislocation of other internal joint prosthesis, initial encounter: Secondary | ICD-10-CM

## 2012-03-28 NOTE — ED Notes (Addendum)
The patient states that she has a prior history of both left and right hip dislocations.  Today she was playing tug-of-war with her dog when she heard a popping sound and felt pain in her right hip.  EMS gave her morphine 10 mg, iv and hydromorphone 2 mg, iv for pain and ondansetron 4 mg, iv for nausea.

## 2012-03-29 ENCOUNTER — Encounter (HOSPITAL_COMMUNITY)
Admission: EM | Disposition: A | Discharge status: Home / Self Care | Payer: Self-pay | Source: Home / Self Care | Attending: Emergency Medicine

## 2012-03-29 ENCOUNTER — Encounter (HOSPITAL_COMMUNITY): Payer: Self-pay | Admitting: Emergency Medicine

## 2012-03-29 ENCOUNTER — Encounter (HOSPITAL_COMMUNITY): Payer: Self-pay | Admitting: Certified Registered"

## 2012-03-29 ENCOUNTER — Emergency Department (HOSPITAL_COMMUNITY): Payer: BC Managed Care – PPO | Admitting: Certified Registered"

## 2012-03-29 ENCOUNTER — Emergency Department (HOSPITAL_COMMUNITY): Payer: BC Managed Care – PPO

## 2012-03-29 HISTORY — PX: HIP CLOSED REDUCTION: SHX983

## 2012-03-29 LAB — BASIC METABOLIC PANEL WITH GFR
BUN: 14 mg/dL (ref 6–23)
CO2: 25 meq/L (ref 19–32)
Chloride: 95 meq/L — ABNORMAL LOW (ref 96–112)
Creatinine, Ser: 0.73 mg/dL (ref 0.50–1.10)
Glucose, Bld: 96 mg/dL (ref 70–99)

## 2012-03-29 LAB — BASIC METABOLIC PANEL
Calcium: 10 mg/dL (ref 8.4–10.5)
GFR calc Af Amer: >90 mL/min (ref >90)
GFR calc non Af Amer: 86 mL/min — ABNORMAL LOW (ref >90)
Potassium: 3.6 mEq/L (ref 3.5–5.1)
Sodium: 133 mEq/L — ABNORMAL LOW (ref 135–145)

## 2012-03-29 LAB — CBC
HCT: 35.7 % — ABNORMAL LOW (ref 36.0–46.0)
Hemoglobin: 12.5 g/dL (ref 12.0–15.0)
MCH: 31.6 pg (ref 26.0–34.0)
MCHC: 35 g/dL (ref 30.0–36.0)
MCV: 90.4 fL (ref 78.0–100.0)
Platelets: 175 10*3/uL (ref 150–400)
RBC: 3.95 MIL/uL (ref 3.87–5.11)
RDW: 12.2 % (ref 11.5–15.5)
WBC: 5.9 K/uL (ref 4.0–10.5)

## 2012-03-29 SURGERY — CLOSED REDUCTION, HIP
Anesthesia: Choice | Site: Hip | Laterality: Right | Wound class: Clean

## 2012-03-29 MED ORDER — HYDROMORPHONE HCL PF 1 MG/ML IJ SOLN: 0.2500 mg | INTRAMUSCULAR | Status: DC | PRN

## 2012-03-29 MED ORDER — HYDROMORPHONE HCL PF 1 MG/ML IJ SOLN: 1.0000 mg | INTRAMUSCULAR | Status: DC | PRN

## 2012-03-29 MED ORDER — LACTATED RINGERS IV SOLN
INTRAVENOUS | Status: DC | PRN
  Administered 2012-03-29: 06:00:00 via INTRAVENOUS

## 2012-03-29 MED ORDER — LIDOCAINE HCL (CARDIAC) 20 MG/ML IV SOLN
INTRAVENOUS | Status: DC | PRN
  Administered 2012-03-29: 80 mg via INTRAVENOUS

## 2012-03-29 MED ORDER — PROPOFOL 10 MG/ML IV BOLUS
INTRAVENOUS | Status: DC | PRN
  Administered 2012-03-29: 170 mg via INTRAVENOUS

## 2012-03-29 MED ORDER — ONDANSETRON HCL 4 MG/2ML IJ SOLN: 4.0000 mg | Freq: Four times a day (QID) | INTRAMUSCULAR | Status: DC | PRN

## 2012-03-29 MED ORDER — ONDANSETRON HCL 4 MG PO TABS: 4.0000 mg | ORAL_TABLET | Freq: Three times a day (TID) | ORAL | Status: AC | PRN

## 2012-03-29 MED ORDER — DIAZEPAM 5 MG/ML IJ SOLN
5.0000 mg | Freq: Once | INTRAMUSCULAR | Status: AC
  Administered 2012-03-29: 5 mg via INTRAVENOUS
  Filled 2012-03-29: qty 2

## 2012-03-29 MED ORDER — MORPHINE SULFATE 2 MG/ML IJ SOLN: 1.0000 mg | INTRAMUSCULAR | Status: DC | PRN

## 2012-03-29 MED ORDER — SUCCINYLCHOLINE CHLORIDE 20 MG/ML IJ SOLN
INTRAMUSCULAR | Status: DC | PRN
  Administered 2012-03-29: 100 mg via INTRAVENOUS

## 2012-03-29 MED ORDER — HYDROCODONE-ACETAMINOPHEN 5-325 MG PO TABS: 1.0000 | ORAL_TABLET | Freq: Four times a day (QID) | ORAL | Status: DC | PRN

## 2012-03-29 MED ORDER — SUFENTANIL CITRATE 50 MCG/ML IV SOLN
INTRAVENOUS | Status: DC | PRN
  Administered 2012-03-29: 15 ug via INTRAVENOUS

## 2012-03-29 MED ORDER — MIDAZOLAM HCL 5 MG/5ML IJ SOLN
INTRAMUSCULAR | Status: DC | PRN
  Administered 2012-03-29: 2 mg via INTRAVENOUS

## 2012-03-29 MED ORDER — HYDROCODONE-ACETAMINOPHEN 5-325 MG PO TABS: 1.0000 | ORAL_TABLET | Freq: Three times a day (TID) | ORAL | Status: AC | PRN

## 2012-03-29 MED ORDER — ACETAMINOPHEN 325 MG PO TABS: 325.0000 mg | ORAL_TABLET | Freq: Four times a day (QID) | ORAL | Status: DC | PRN

## 2012-03-29 SURGICAL SUPPLY — 2 items
IMMOBILIZER KNEE 20 (SOFTGOODS) ×2
IMMOBILIZER KNEE 20 THIGH 36 (SOFTGOODS) IMPLANT

## 2012-03-29 NOTE — Anesthesia Preprocedure Evaluation (Addendum)
Anesthesia Evaluation  Patient identified by MRN, date of birth, ID band Patient awake    Reviewed: Allergy & Precautions, H&P , NPO status   History of Anesthesia Complications Negative for: history of anesthetic complications  Airway Mallampati: II  Neck ROM: full    Dental   Pulmonary neg pulmonary ROS,          Cardiovascular Exercise Tolerance: Good negative cardio ROS      Neuro/Psych negative neurological ROS  negative psych ROS   GI/Hepatic negative GI ROS, Neg liver ROS,   Endo/Other  negative endocrine ROS  Renal/GU negative Renal ROS  negative genitourinary   Musculoskeletal negative musculoskeletal ROS (+)   Abdominal   Peds  Hematology negative hematology ROS (+)   Anesthesia Other Findings   Reproductive/Obstetrics negative OB ROS                          Anesthesia Physical Anesthesia Plan  ASA: I and Emergent  Anesthesia Plan: General   Post-op Pain Management:    Induction: Intravenous  Airway Management Planned: Mask  Additional Equipment:   Intra-op Plan:   Post-operative Plan:   Informed Consent:   Dental advisory given  Plan Discussed with: CRNA, Anesthesiologist and Surgeon  Anesthesia Plan Comments:        Anesthesia Quick Evaluation

## 2012-03-29 NOTE — Transfer of Care (Signed)
Immediate Anesthesia Transfer of Care Note  Patient: Patricia [name]  Procedure(s) Performed: Procedure(s) (LRB): CLOSED REDUCTION HIP (Right)  Patient Location: PACU  Anesthesia Type: General  Level of Consciousness: awake, alert , oriented, patient cooperative and responds to stimulation  Airway & Oxygen Therapy: Patient Spontanous Breathing and Patient connected to nasal cannula oxygen  Post-op Assessment: Report given to PACU RN, Post -op Vital signs reviewed and stable and Patient moving all extremities  Post vital signs: Reviewed and stable  Complications: No apparent anesthesia complications

## 2012-03-29 NOTE — Brief Op Note (Signed)
03/28/2012 - 03/29/2012  6:27 AM  PATIENT:  Patricia [name]  71 y.o. female  PRE-OPERATIVE DIAGNOSIS:  right hip dislocation  POST-OPERATIVE DIAGNOSIS:  right hip dislocation  PROCEDURE:  Procedure(s) (LRB): CLOSED REDUCTION HIP (Right)  SURGEON:  Surgeon(s) and Role:    * Budd Palmer, MD - Primary  PHYSICIAN ASSISTANT: Montez Morita, [facility]  ANESTHESIA:   general  EBL:     BLOOD ADMINISTERED:none  DRAINS: none   LOCAL MEDICATIONS USED:  NONE  SPECIMEN:  No Specimen  DISPOSITION OF SPECIMEN:  N/A  COUNTS:  YES  TOURNIQUET:  * No tourniquets in log *  DICTATION: .Other Dictation: Dictation Number [id number]  PLAN OF CARE: Discharge to home after PACU  PATIENT DISPOSITION:  PACU - hemodynamically stable.   Delay start of Pharmacological VTE agent (>24hrs) due to surgical blood loss or risk of bleeding: no

## 2012-03-29 NOTE — ED Notes (Signed)
Patient transported to X-ray 

## 2012-03-29 NOTE — H&P (Signed)
Chief Complaint:  HPI: Patricia [name] is an 71 y.o. Female who has no significant medical issues who is at home yesterday evening when she was playing with her dog and playing tug of war.  Patient noted that her right leg was in a less than optimal position for protection against dislocation. While the they were playing the patient felt a sharp pop in her right hip and was unable to bear weight. Patient was at home in [place] and decided to come to [place] to be evaluated at [facility] as Dr. Thurston Hole was her operating surgeon. Patient did have a right hip dislocation approximately 3 months ago and this was reduced by Dr. Cleophas Dunker in the OR as well. Patient has yet to followup with Dr. Thurston Hole after her previous dislocation she was not placed into a hip abduction orthosis at that time as well she is also not followed up with Dr. Cleophas Dunker after the reduction was performed as well.  Patient was seen and evaluated in the emergency department where she was found to have a posterior right hip dislocation of her total hip. The orthopedic trauma service was contacted as we were on call this evening.  given the previous history of a right hip dislocation and necessity to go to the OR we decided to take the patient to the operating room for closed reduction.  Patient was seen in the OR holding area. She denied any additional injuries. Her right leg is held in a plastic deformity for a posterior hip dislocation. She does report a little bit of decreased sensation along the plantar aspect of her right foot. I denies any chest pain, shortness of breath no additional issues are.  History reviewed. No pertinent past medical history.  Past Surgical History  Procedure Date  . Total hip arthroplasty   . Hip closed reduction 11/12/2011    Procedure: CLOSED REDUCTION HIP;  Surgeon: Valeria Batman, MD;  Location: [facility] OR;  Service: Orthopedics;  Laterality: Right;    History reviewed. No pertinent family  history. Social History:  reports that she has never smoked. She does not have any smokeless tobacco history on file. She reports that she drinks alcohol. She reports that she does not use illicit drugs.  Allergies:  Allergies  Allergen Reactions  . Tobrex (Tobramycin Sulfate) Swelling     (Not in a hospital admission)  Results for orders placed during the hospital encounter of 03/28/12 (from the past 48 hour(s))  CBC     Status: Abnormal   Collection Time   03/29/12 12:45 AM      Component Value Range Comment   WBC 5.9  4.0 - 10.5 K/uL    RBC 3.95  3.87 - 5.11 MIL/uL    Hemoglobin 12.5  12.0 - 15.0 g/dL    HCT 16.1 (*) 09.6 - 46.0 %    MCV 90.4  78.0 - 100.0 fL    MCH 31.6  26.0 - 34.0 pg    MCHC 35.0  30.0 - 36.0 g/dL    RDW 04.5  40.9 - 81.1 %    Platelets 175  150 - 400 K/uL   BASIC METABOLIC PANEL     Status: Abnormal   Collection Time   03/29/12 12:45 AM      Component Value Range Comment   Sodium 133 (*) 135 - 145 mEq/L    Potassium 3.6  3.5 - 5.1 mEq/L    Chloride 95 (*) 96 - 112 mEq/L    CO2  25  19 - 32 mEq/L    Glucose, Bld 96  70 - 99 mg/dL    BUN 14  6 - 23 mg/dL    Creatinine, Ser 0.98  0.50 - 1.10 mg/dL    Calcium 11.9  8.4 - 10.5 mg/dL    GFR calc non Af Amer 86 (*) >90 mL/min    GFR calc Af Amer >90  >90 mL/min    Dg Hip Complete Right  03/29/2012  *RADIOLOGY REPORT*  Clinical Data: Right hip pain status post fall.  RIGHT HIP - COMPLETE 2+ VIEW  Comparison: 11/12/2011  Findings: Bilateral total hip arthroplasties.  Interval posterior dislocation of the right femoral component.  Osteopenia.  No periprosthetic lucency.  IMPRESSION: Posterior dislocation of the right femoral component.  Original Report Authenticated By: Waneta Martins, M.D.    Review of Systems  Constitutional: Negative for fever and chills.  HENT: Negative for sore throat.   Eyes: Negative for blurred vision and pain.  Respiratory: Negative for cough, shortness of breath and wheezing.    Cardiovascular: Negative for chest pain and palpitations.  Gastrointestinal: Negative for nausea, vomiting and abdominal pain.  Genitourinary: Negative for dysuria.  Musculoskeletal:       R hip pain  Neurological: Positive for tingling. Negative for headaches.       R leg, plantar surface    Blood pressure 126/65, pulse 65, temperature 98 F (36.7 C), temperature source Oral, resp. rate 13, SpO2 95.00%. Physical Exam  Constitutional: Vital signs are normal. She appears well-developed and well-nourished. She is active and cooperative.  HENT:  Head: Normocephalic and atraumatic.  Mouth/Throat: Oropharynx is clear and moist and mucous membranes are normal.  Eyes: EOM are normal.  Neck: Normal range of motion and full passive range of motion without pain. Neck supple. No spinous process tenderness present.  Cardiovascular: Normal rate, regular rhythm, S1 normal and S2 normal.   No murmur heard. Respiratory: Effort normal and breath sounds normal. No accessory muscle usage. No respiratory distress. She has no wheezes. She has no rhonchi.  GI: Soft. Normal appearance. There is no tenderness.       + BS  Musculoskeletal:       Right  Lower Extremity   Right leg is held in abduction and internal rotation   Pain with hip motion is noted   Nontender to the right knee and right ankle. No acute findings noted at the right knee, right ankle, right foot.   No significant swelling noted.   Extremity is warm   Palpable dorsalis pedis pulses noted.    Deep peroneal nerve and superficial peroneal nerve sensory functions are intact   Patient reports decreased tibial nerve sensation to light touch.   EHL, FHL, anterior tibialis, posterior tibialis, peroneals and gastrocsoleus complex motor function are grossly intact.   Knee motion, quad function and hamstring function not assessed secondary to exacerbation of right hip pain.     Compartments are soft and nontender   No additional injuries are  identified    Neurological: She is alert.       See musculoskeletal evaluation  Skin: Skin is warm and intact. No abrasion and no rash noted.  Psychiatric: She has a normal mood and affect. Her speech is normal and behavior is normal. Thought content normal. Cognition and memory are normal.     Assessment/Plan  71 year old female with right posterior hip dislocation of a total hip arthroplasty status post tug of war accident  1. right  hip posterior dislocation, THA  Plan for the OR for adequate muscle relaxation for closed reduction.  I discussed hip abduction orthosis with patient and she adamantly refused.  We will keep the patient for a couple hours after reduction to make sure she is doing well.  Ideally like to have her up with therapy as well prior to discharge.  Will also make Dr. Thurston Hole aware and we've strongly urged patient to followup with Dr. Thurston Hole ASAP as her right hip likely needs a revision as this is her second dislocation in 3 months.  Likely place pt in knee immobilizer at least 2. Disposition  OR for closed reduction right hip   Mearl Latin, PA-C [facility] [phone] (P)  03/29/2012, 5:48 AM

## 2012-03-29 NOTE — Anesthesia Postprocedure Evaluation (Signed)
Anesthesia Post Note  Patient: Patricia [name]  Procedure(s) Performed: Procedure(s) (LRB): CLOSED REDUCTION HIP (Right)  Anesthesia type: General  Patient location: PACU  Post pain: Pain level controlled and Adequate analgesia  Post assessment: Post-op Vital signs reviewed, Patient's Cardiovascular Status Stable, Respiratory Function Stable, Patent Airway and Pain level controlled  Last Vitals:  Filed Vitals:   03/29/12 0714  BP: 122/61  Pulse: 72  Temp:   Resp: 12    Post vital signs: Reviewed and stable  Level of consciousness: awake, alert  and oriented  Complications: No apparent anesthesia complications

## 2012-03-29 NOTE — ED Provider Notes (Signed)
History     CSN: [id number]  Arrival date & time 03/28/12  2325   First MD Initiated Contact with Patient 03/28/12 2334      Chief Complaint  Patient presents with  . Hip Pain    (Consider location/radiation/quality/duration/timing/severity/associated sxs/prior treatment) HPI Comments: Patient reports at home she is being pulled by her dog and her legs came together and felt a pop and deformity with pain to her right hip. She had previously had a dislocation of her right prosthesis approximately 3 months ago. She reports that they attempted to reduce it in the emergency department, however her conscious sedation failed x3. Ultimately she was taken to the operating room. She informs me that sure preference would be to take him directly to the OR for reduction if that is the case. She denies any direct blow to her head. She denies any other injuries. She comes by EMS and was given analgesics by IV and she feels fairly comfortable at this moment as long as she is not moving. Movement of her right hip makes the pain excessively worse. She last ate dinner at about 5 PM.  Patient is a 71 y.o. female presenting with hip pain. The history is provided by the patient and medical records.  Hip Pain    History reviewed. No pertinent past medical history.  Past Surgical History  Procedure Date  . Total hip arthroplasty   . Hip closed reduction 11/12/2011    Procedure: CLOSED REDUCTION HIP;  Surgeon: Valeria Batman, MD;  Location: [facility] OR;  Service: Orthopedics;  Laterality: Right;    History reviewed. No pertinent family history.  History  Substance Use Topics  . Smoking status: Never Smoker   . Smokeless tobacco: Not on file  . Alcohol Use: Yes    OB History    Grav Para Term Preterm Abortions TAB SAB Ect Mult Living                  Review of Systems  Musculoskeletal: Positive for arthralgias.  Skin: Negative for wound.  Neurological: Negative for weakness and numbness.  All  other systems reviewed and are negative.    Allergies  Tobrex  Home Medications   Current Outpatient Rx  Name Route Sig Dispense Refill  . IBUPROFEN 200 MG PO TABS Oral Take 600 mg by mouth every 6 (six) hours as needed. For pain      BP 155/88  Pulse 92  Temp 98 F (36.7 C) (Oral)  Resp 20  SpO2 95%  Physical Exam  Nursing note and vitals reviewed. Constitutional: She is oriented to person, place, and time. She appears well-developed and well-nourished. No distress.  HENT:  Head: Normocephalic and atraumatic.  Cardiovascular: Normal rate and regular rhythm.   No murmur heard. Pulmonary/Chest: Effort normal. No respiratory distress. She has no wheezes.  Abdominal: Soft. She exhibits no distension. There is no tenderness.  Musculoskeletal:       Right hip: She exhibits decreased range of motion, tenderness and bony tenderness. She exhibits normal strength.       Right hip is flexed and internally rotated. She is unable to range her right hip do to severe pain. Distal DP pulses 2+. Gross sensation and movement of her right foot and toes are intact.  Neurological: She is oriented to person, place, and time.  Skin: Skin is warm.    ED Course  Procedures (including critical care time)   Labs Reviewed  CBC  BASIC METABOLIC PANEL  Dg Hip Complete Right  03/29/2012  *RADIOLOGY REPORT*  Clinical Data: Right hip pain status post fall.  RIGHT HIP - COMPLETE 2+ VIEW  Comparison: 11/12/2011  Findings: Bilateral total hip arthroplasties.  Interval posterior dislocation of the right femoral component.  Osteopenia.  No periprosthetic lucency.  IMPRESSION: Posterior dislocation of the right femoral component.  Original Report Authenticated By: Waneta Martins, M.D.     1. Dislocation, hip closed     Room air saturation is 95% and I interpret this to be normal. EKG at time 00:46, shows normal sinus rhythm at a rate of 80. Normal axis. Nonspecific ST abnormalities noted. These  are unchanged compared to EKG from 11/12/2011. I interpret this to be a borderline EKG with no changes from priors.  12:55 AM I spoke to Dr. Carola Frost who will see the patient in the emergency department.  MDM  I reviewed the film myself. 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 There is no direct trauma. Patient's pain is improved if she is not moving and was given analgesics by EMS prior to arrival. She reports that previously, 3 attempts at reduction in the emergency department failed and ultimately she was taken to the operating room. Therefore I will try to contact [facility] and see if she can be taken to the operating room for reduction of her hip dislocation. She is intact distal neuro vascularly.        Gavin Pound. Oletta Lamas, MD 03/29/12 8469

## 2012-03-29 NOTE — H&P (Signed)
I have seen and examined the patient. I agree with the findings above. I discussed with the patient the risks and benefits of closed reduction under anesthesia, including the possibility of nerve injury, vessel injury, recurrent dislocation, and need for open reduction or further surgery among others.  She understands these risks and wishes to proceed.  Budd Palmer, MD 03/29/2012 6:08 AM

## 2012-03-30 NOTE — Op Note (Signed)
NAME:  Patricia, [name]                 ACCOUNT NO.:  [id number]  MEDICAL RECORD NO.:  [id number]  LOCATION:  OTFC                         FACILITY:  [facility]  PHYSICIAN:  Doralee Albino. Carola Frost, M.D. DATE OF BIRTH:  1941/01/11  DATE OF PROCEDURE:  03/29/2012 DATE OF DISCHARGE:  03/29/2012                              OPERATIVE REPORT   PREOPERATIVE DIAGNOSIS:  Right hip dislocation, posterior.  POSTOPERATIVE DIAGNOSIS:  Right hip dislocation, posterior.  PROCEDURE: 1. Closed reduction of right hip prosthetic dislocation. 2. Stress fluoroscopy, right hip.  SURGEON:  Doralee Albino. Carola Frost, MD  ASSISTING:  Mearl Latin, PA-C  ANESTHESIA:  General, [place].  COMPLICATIONS:  None.  DISPOSITION:  To PACU.  CONDITION:  Stable.  BRIEF SUMMARY OF INDICATION FOR PROCEDURE:  Patricia [name] is a very pleasant 71 year old female who is now about 10 years post right hip replacement, who sustained a dislocation in February and now has recurrence after a tug of war with her dog in a seated position, during which, she adducted her hip.  The patient reports pain, some mild paresthesia with no motor changes.  I discussed with her the x-ray findings, the risk of fracture, the possibility of cup wear to the extent that she has excessive play in her joint at this time and may require further evaluation and eventually possible revision.  We also discussed her cement mantle proximally where there does appear to have developed some loosening.  She understood the risks and benefits of closed reduction including the possibility of fracture, recurrent dislocation, anesthetic complications, and multiple others. Furthermore, she requested placement of an in-and-out catheter as the patient has been unable to void secondary to pain and immobility from her hip.  BRIEF SUMMARY OF PROCEDURE:  The patient was taken to the operating room where general anesthesia was induced.  Her right hip which was internally rotated and  adducted was then brought into flexion and adduction.  After general anesthesia, gentle longitudinal traction resulted in elongation of a muscle and atraumatic reduction.  The patient then underwent fluoro views and examination there where she was stable over the allowable range, but as the hip was brought into internal rotation, past her precaution levels, the beginnings of subluxation could be appreciated.  I did not allow the hip to dislocate all the way.  An I-and-O catheter was then placed.  The patient was awakened from anesthesia and transported to the PACU in stable condition.  Montez Morita, PA-C assisted me and he was the one who held countertraction, while the other relocated the hip.  PROGNOSIS:  Ms. Parsley was encouraged strongly to use an abduction orthosis which should be continued until she follows up with Dr. Thurston Hole. I did discuss this with her as did my PA preoperatively and the patient refused this and stated that she was willing to use a knee immobilizer. I have contacted Julien Girt, PA for Dr. Thurston Hole who is aware of the patient and will likely see her prior to her discharge.  She remains at increased risk for subsequent dislocation and given her young age and 10-year history with the x-ray finding, she may have been generating sufficient wear to the point that  she has more play than in the past and this is allowing her to now have these episodes of dislocation.     Doralee Albino. Carola Frost, M.D.     MHH/MEDQ  D:  03/29/2012  T:  03/29/2012  Job:  [id number]

## 2012-03-31 ENCOUNTER — Encounter (HOSPITAL_COMMUNITY): Payer: Self-pay | Admitting: Orthopedic Surgery

## 2012-04-14 ENCOUNTER — Encounter (HOSPITAL_COMMUNITY): Payer: Self-pay

## 2012-04-17 ENCOUNTER — Other Ambulatory Visit: Payer: Self-pay | Admitting: Orthopedic Surgery

## 2012-04-26 NOTE — H&P (Signed)
HPI: Patient presents with a chief complaint of a recurrent right hip dislocations.  She reports that her hip was replaced by Dr. Thurston Hole approximately 15 years ago.  The right hip did very well until February 2013 when she sustained a dislocation while she was dusting at home.  She had a second dislocation in July when she was sitting in a recliner playing with her dog.  Both had closed reductions in the ER.  She denies any pain in her hip.   Dr. Thurston Hole sent her over for discussion about possible revision surgery.  She also has a total hip on the left side that is approximately 69 years old and seems to be doing well.  All: TobraDex eye drops  ROS: 14 point review of systems form filled out by the patient was reviewed and was negative as it relates to the history of present illness except for: Contacts  PMH: Status post bilateral total hip arthroplasties with 2 closed reduction of the right hip  FHx:, Hypertension, diabetes, heart disease  SocHx: Denies use of tobacco and drinks occasional alcohol.  She is a Engineer, civil (consulting).  PE: Well-developed, well-nourished 71 year old female who is alert and oriented and in no acute distress.  Exam of the right hip demonstrates approximately 40 of internal and external rotation without pain.  Foot tap is negative.  She is neurovascularly intact.  Imaging/Tests: X-rays of the right hip taken previously were reviewed in our office today and demonstrate a slightly vertical cup.  It was measured at approximately 64.  Leg lengths appear to be about equal.  She has a large ball total hip on the left side with a more horizontal cup.  Asses: Recurrent right hip dislocations  Plan: We have discussed the options in detail with Patricia [name] today.  We will plan to revise the cup and make the angle slightly more horizontal.  We will also use a larger ball.  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

## 2012-04-27 ENCOUNTER — Ambulatory Visit (HOSPITAL_COMMUNITY)
Admission: RE | Admit: 2012-04-27 | Discharge: 2012-04-27 | Disposition: A | Discharge status: Home / Self Care | Payer: BC Managed Care – PPO | Source: Ambulatory Visit | Attending: Orthopedic Surgery | Admitting: Orthopedic Surgery

## 2012-04-27 ENCOUNTER — Encounter (HOSPITAL_COMMUNITY)
Admission: RE | Admit: 2012-04-27 | Discharge: 2012-04-27 | Disposition: A | Discharge status: Home / Self Care | Payer: BC Managed Care – PPO | Source: Ambulatory Visit | Attending: Orthopedic Surgery | Admitting: Orthopedic Surgery

## 2012-04-27 ENCOUNTER — Encounter (HOSPITAL_COMMUNITY): Payer: Self-pay

## 2012-04-27 DIAGNOSIS — Z01818 Encounter for other preprocedural examination: Secondary | ICD-10-CM | POA: Insufficient documentation

## 2012-04-27 DIAGNOSIS — Z01812 Encounter for preprocedural laboratory examination: Secondary | ICD-10-CM | POA: Insufficient documentation

## 2012-04-27 LAB — URINE MICROSCOPIC-ADD ON

## 2012-04-27 LAB — SURGICAL PCR SCREEN: MRSA, PCR: NEGATIVE

## 2012-04-27 LAB — URINALYSIS, ROUTINE W REFLEX MICROSCOPIC
Glucose, UA: NEGATIVE mg/dL
Nitrite: NEGATIVE
Protein, ur: NEGATIVE mg/dL

## 2012-04-27 LAB — BASIC METABOLIC PANEL
BUN: 11 mg/dL (ref 6–23)
Chloride: 92 mEq/L — ABNORMAL LOW (ref 96–112)
Glucose, Bld: 98 mg/dL (ref 70–99)
Potassium: 4.1 mEq/L (ref 3.5–5.1)

## 2012-04-27 LAB — CBC WITH DIFFERENTIAL/PLATELET
Basophils Relative: 0 % (ref 0–1)
Eosinophils Absolute: 0 10*3/uL (ref 0.0–0.7)
HCT: 37.5 % (ref 36.0–46.0)
Hemoglobin: 13.3 g/dL (ref 12.0–15.0)
Lymphs Abs: 1.1 10*3/uL (ref 0.7–4.0)
MCH: 32.7 pg (ref 26.0–34.0)
MCHC: 35.5 g/dL (ref 30.0–36.0)
Monocytes Absolute: 0.5 10*3/uL (ref 0.1–1.0)
Monocytes Relative: 11 % (ref 3–12)
Neutro Abs: 3.2 10*3/uL (ref 1.7–7.7)

## 2012-04-27 LAB — TYPE AND SCREEN: ABO/RH(D): O POS

## 2012-04-27 MED ORDER — CHLORHEXIDINE GLUCONATE 4 % EX LIQD: 60.0000 mL | Freq: Once | CUTANEOUS | Status: DC

## 2012-04-27 NOTE — Pre-Procedure Instructions (Signed)
20 Patricia [name]  04/27/2012   Your procedure is scheduled on:  05/01/12 Monday  Report to [facility] at 0800 AM.  Call this number if you have problems the morning of surgery: [phone]   Remember:   Do not eat food:After Midnight.  May have clear liquids:until Midnight .  Clear liquids include soda, tea, black coffee, apple or grape juice, broth.  Take these medicines the morning of surgery with Patricia SIP OF WATER: none   Do not wear jewelry, make-up or nail polish.  Do not wear lotions, powders, or perfumes. You may wear deodorant.  Do not shave 48 hours prior to surgery. Men may shave face and neck.  Do not bring valuables to the hospital.  Contacts, dentures or bridgework may not be worn into surgery.  Leave suitcase in the car. After surgery it may be brought to your room.  For patients admitted to the hospital, checkout time is 11:00 AM the day of discharge.   Patients discharged the day of surgery will not be allowed to drive home.  Name and phone number of your driver:   Patricia [name]- husband  Cell [phone]  Special Instructions: CHG Shower Use Special Wash: 1/2 bottle night before surgery and 1/2 bottle morning of surgery.   Please read over the following fact sheets that you were given: Pain Booklet, Coughing and Deep Breathing, Blood Transfusion Information, Lab Information, Total Joint Packet, MRSA Information and Surgical Site Infection Prevention

## 2012-04-28 NOTE — Progress Notes (Signed)
Called patient and notified patient to arrival at 07:30

## 2012-04-30 MED ORDER — CEFAZOLIN SODIUM-DEXTROSE 2-3 GM-% IV SOLR
2.0000 g | INTRAVENOUS | Status: AC
  Administered 2012-05-01: 2 g via INTRAVENOUS
  Filled 2012-04-30: qty 50

## 2012-05-01 ENCOUNTER — Inpatient Hospital Stay (HOSPITAL_COMMUNITY): Payer: BC Managed Care – PPO

## 2012-05-01 ENCOUNTER — Encounter (HOSPITAL_COMMUNITY)
Admission: RE | Disposition: A | Discharge status: Home / Self Care | Payer: Self-pay | Source: Ambulatory Visit | Attending: Orthopedic Surgery

## 2012-05-01 ENCOUNTER — Inpatient Hospital Stay (HOSPITAL_COMMUNITY): Payer: BC Managed Care – PPO | Admitting: Certified Registered"

## 2012-05-01 ENCOUNTER — Encounter (HOSPITAL_COMMUNITY): Payer: Self-pay | Admitting: Certified Registered"

## 2012-05-01 ENCOUNTER — Encounter (HOSPITAL_COMMUNITY): Payer: Self-pay | Admitting: *Deleted

## 2012-05-01 ENCOUNTER — Inpatient Hospital Stay (HOSPITAL_COMMUNITY)
Admission: RE | Admit: 2012-05-01 | Discharge: 2012-05-03 | DRG: 817 | Disposition: A | Discharge status: Home / Self Care | Payer: BC Managed Care – PPO | Source: Ambulatory Visit | Attending: Orthopedic Surgery | Admitting: Orthopedic Surgery

## 2012-05-01 DIAGNOSIS — T84018A Broken internal joint prosthesis, other site, initial encounter: Secondary | ICD-10-CM

## 2012-05-01 DIAGNOSIS — T84028A Dislocation of other internal joint prosthesis, initial encounter: Secondary | ICD-10-CM

## 2012-05-01 DIAGNOSIS — I10 Essential (primary) hypertension: Secondary | ICD-10-CM | POA: Diagnosis present

## 2012-05-01 DIAGNOSIS — T84099A Other mechanical complication of unspecified internal joint prosthesis, initial encounter: Principal | ICD-10-CM | POA: Diagnosis present

## 2012-05-01 DIAGNOSIS — M129 Arthropathy, unspecified: Secondary | ICD-10-CM | POA: Diagnosis present

## 2012-05-01 DIAGNOSIS — Z96649 Presence of unspecified artificial hip joint: Secondary | ICD-10-CM

## 2012-05-01 DIAGNOSIS — Y831 Surgical operation with implant of artificial internal device as the cause of abnormal reaction of the patient, or of later complication, without mention of misadventure at the time of the procedure: Secondary | ICD-10-CM | POA: Diagnosis present

## 2012-05-01 HISTORY — PX: TOTAL HIP REVISION: SHX763

## 2012-05-01 SURGERY — TOTAL HIP REVISION
Anesthesia: General | Site: Hip | Laterality: Right | Wound class: Clean

## 2012-05-01 MED ORDER — METOCLOPRAMIDE HCL 5 MG/ML IJ SOLN: 5.0000 mg | Freq: Three times a day (TID) | INTRAMUSCULAR | Status: DC | PRN

## 2012-05-01 MED ORDER — BUPIVACAINE-EPINEPHRINE (PF) 0.5% -1:200000 IJ SOLN
INTRAMUSCULAR | Status: AC
  Filled 2012-05-01: qty 10

## 2012-05-01 MED ORDER — LIDOCAINE HCL (CARDIAC) 20 MG/ML IV SOLN
INTRAVENOUS | Status: DC | PRN
  Administered 2012-05-01: 60 mg via INTRAVENOUS

## 2012-05-01 MED ORDER — MAGNESIUM HYDROXIDE 400 MG/5ML PO SUSP
30.0000 mL | Freq: Every day | ORAL | Status: DC | PRN
  Administered 2012-05-02: 30 mL via ORAL
  Filled 2012-05-01: qty 30

## 2012-05-01 MED ORDER — PHENOL 1.4 % MT LIQD: 1.0000 | OROMUCOSAL | Status: DC | PRN

## 2012-05-01 MED ORDER — MENTHOL 3 MG MT LOZG: 1.0000 | LOZENGE | OROMUCOSAL | Status: DC | PRN

## 2012-05-01 MED ORDER — ONDANSETRON HCL 4 MG/2ML IJ SOLN: 4.0000 mg | Freq: Four times a day (QID) | INTRAMUSCULAR | Status: DC | PRN

## 2012-05-01 MED ORDER — METOCLOPRAMIDE HCL 5 MG PO TABS
5.0000 mg | ORAL_TABLET | Freq: Three times a day (TID) | ORAL | Status: DC | PRN
  Filled 2012-05-01: qty 2

## 2012-05-01 MED ORDER — HYDROMORPHONE HCL PF 1 MG/ML IJ SOLN
0.2500 mg | INTRAMUSCULAR | Status: DC | PRN
  Administered 2012-05-01: 0.25 mg via INTRAVENOUS

## 2012-05-01 MED ORDER — ACETAMINOPHEN 325 MG PO TABS: 650.0000 mg | ORAL_TABLET | Freq: Four times a day (QID) | ORAL | Status: DC | PRN

## 2012-05-01 MED ORDER — ROCURONIUM BROMIDE 100 MG/10ML IV SOLN
INTRAVENOUS | Status: DC | PRN
  Administered 2012-05-01: 50 mg via INTRAVENOUS

## 2012-05-01 MED ORDER — ONDANSETRON HCL 4 MG/2ML IJ SOLN: 4.0000 mg | Freq: Once | INTRAMUSCULAR | Status: DC | PRN

## 2012-05-01 MED ORDER — HYDROMORPHONE HCL PF 1 MG/ML IJ SOLN
1.0000 mg | INTRAMUSCULAR | Status: DC | PRN
  Administered 2012-05-02: 1 mg via INTRAVENOUS
  Filled 2012-05-01: qty 1

## 2012-05-01 MED ORDER — DEXTROSE-NACL 5-0.45 % IV SOLN: INTRAVENOUS | Status: DC

## 2012-05-01 MED ORDER — ASPIRIN EC 325 MG PO TBEC
325.0000 mg | DELAYED_RELEASE_TABLET | Freq: Two times a day (BID) | ORAL | Status: DC
  Administered 2012-05-01 – 2012-05-03 (×4): 325 mg via ORAL
  Filled 2012-05-01 (×5): qty 1

## 2012-05-01 MED ORDER — KCL IN DEXTROSE-NACL 20-5-0.45 MEQ/L-%-% IV SOLN
INTRAVENOUS | Status: DC
  Administered 2012-05-01 – 2012-05-02 (×3): via INTRAVENOUS
  Filled 2012-05-01 (×7): qty 1000

## 2012-05-01 MED ORDER — NEOSTIGMINE METHYLSULFATE 1 MG/ML IJ SOLN
INTRAMUSCULAR | Status: DC | PRN
  Administered 2012-05-01: 3 mg via INTRAVENOUS

## 2012-05-01 MED ORDER — ACETAMINOPHEN 10 MG/ML IV SOLN
INTRAVENOUS | Status: AC
  Filled 2012-05-01: qty 100

## 2012-05-01 MED ORDER — HYDROMORPHONE HCL PF 1 MG/ML IJ SOLN
INTRAMUSCULAR | Status: AC
  Filled 2012-05-01: qty 1

## 2012-05-01 MED ORDER — ACETAMINOPHEN 10 MG/ML IV SOLN
1000.0000 mg | Freq: Four times a day (QID) | INTRAVENOUS | Status: AC
  Administered 2012-05-01 – 2012-05-02 (×3): 1000 mg via INTRAVENOUS
  Filled 2012-05-01 (×4): qty 100

## 2012-05-01 MED ORDER — SODIUM CHLORIDE 0.9 % IR SOLN
Status: DC | PRN
  Administered 2012-05-01: 1000 mL

## 2012-05-01 MED ORDER — ACETAMINOPHEN 650 MG RE SUPP: 650.0000 mg | Freq: Four times a day (QID) | RECTAL | Status: DC | PRN

## 2012-05-01 MED ORDER — LACTATED RINGERS IV SOLN
INTRAVENOUS | Status: DC
  Administered 2012-05-01: 09:00:00 via INTRAVENOUS

## 2012-05-01 MED ORDER — DIPHENHYDRAMINE HCL 12.5 MG/5ML PO ELIX: 12.5000 mg | ORAL_SOLUTION | ORAL | Status: DC | PRN

## 2012-05-01 MED ORDER — BISACODYL 5 MG PO TBEC: 5.0000 mg | DELAYED_RELEASE_TABLET | Freq: Every day | ORAL | Status: DC | PRN

## 2012-05-01 MED ORDER — METHOCARBAMOL 100 MG/ML IJ SOLN
500.0000 mg | Freq: Four times a day (QID) | INTRAVENOUS | Status: DC | PRN
  Administered 2012-05-01: 500 mg via INTRAVENOUS
  Filled 2012-05-01: qty 5

## 2012-05-01 MED ORDER — FENTANYL CITRATE 0.05 MG/ML IJ SOLN
INTRAMUSCULAR | Status: DC | PRN
  Administered 2012-05-01: 100 ug via INTRAVENOUS
  Administered 2012-05-01 (×3): 50 ug via INTRAVENOUS

## 2012-05-01 MED ORDER — GLYCOPYRROLATE 0.2 MG/ML IJ SOLN
INTRAMUSCULAR | Status: DC | PRN
  Administered 2012-05-01: 0.4 mg via INTRAVENOUS

## 2012-05-01 MED ORDER — ZOLPIDEM TARTRATE 5 MG PO TABS: 5.0000 mg | ORAL_TABLET | Freq: Every evening | ORAL | Status: DC | PRN

## 2012-05-01 MED ORDER — PROPOFOL 10 MG/ML IV EMUL
INTRAVENOUS | Status: DC | PRN
  Administered 2012-05-01: 200 mg via INTRAVENOUS

## 2012-05-01 MED ORDER — ONDANSETRON HCL 4 MG/2ML IJ SOLN
INTRAMUSCULAR | Status: DC | PRN
  Administered 2012-05-01: 4 mg via INTRAVENOUS

## 2012-05-01 MED ORDER — EPHEDRINE SULFATE 50 MG/ML IJ SOLN
INTRAMUSCULAR | Status: DC | PRN
  Administered 2012-05-01: 5 mg via INTRAVENOUS

## 2012-05-01 MED ORDER — ALUM & MAG HYDROXIDE-SIMETH 200-200-20 MG/5ML PO SUSP: 30.0000 mL | ORAL | Status: DC | PRN

## 2012-05-01 MED ORDER — BUPIVACAINE-EPINEPHRINE 0.5% -1:200000 IJ SOLN
INTRAMUSCULAR | Status: DC | PRN
  Administered 2012-05-01: 20 mL

## 2012-05-01 MED ORDER — LACTATED RINGERS IV SOLN
INTRAVENOUS | Status: DC | PRN
  Administered 2012-05-01: 10:00:00 via INTRAVENOUS

## 2012-05-01 MED ORDER — OXYCODONE HCL 5 MG PO TABS
5.0000 mg | ORAL_TABLET | ORAL | Status: DC | PRN
  Administered 2012-05-02 (×2): 5 mg via ORAL
  Administered 2012-05-02 – 2012-05-03 (×4): 10 mg via ORAL
  Filled 2012-05-01 (×3): qty 2
  Filled 2012-05-01 (×2): qty 1
  Filled 2012-05-01: qty 2

## 2012-05-01 MED ORDER — ONDANSETRON HCL 4 MG PO TABS: 4.0000 mg | ORAL_TABLET | Freq: Four times a day (QID) | ORAL | Status: DC | PRN

## 2012-05-01 MED ORDER — ACETAMINOPHEN 10 MG/ML IV SOLN
INTRAVENOUS | Status: DC | PRN
  Administered 2012-05-01: 1000 mg via INTRAVENOUS

## 2012-05-01 MED ORDER — METHOCARBAMOL 500 MG PO TABS
500.0000 mg | ORAL_TABLET | Freq: Four times a day (QID) | ORAL | Status: DC | PRN
  Administered 2012-05-01 – 2012-05-02 (×2): 500 mg via ORAL
  Filled 2012-05-01 (×2): qty 1

## 2012-05-01 SURGICAL SUPPLY — 66 items
BOWL SMART MIX CTS (DISPOSABLE) IMPLANT
BRUSH FEMORAL CANAL (MISCELLANEOUS) IMPLANT
CLOTH BEACON ORANGE TIMEOUT ST (SAFETY) ×2 IMPLANT
COVER SURGICAL LIGHT HANDLE (MISCELLANEOUS) ×2 IMPLANT
DRAPE C-ARM 42X72 X-RAY (DRAPES) IMPLANT
DRAPE ORTHO SPLIT 77X108 STRL (DRAPES) ×2
DRAPE PROXIMA HALF (DRAPES) ×2 IMPLANT
DRAPE SURG ORHT 6 SPLT 77X108 (DRAPES) ×1 IMPLANT
DRAPE U-SHAPE 47X51 STRL (DRAPES) ×2 IMPLANT
DRILL BIT 7/64X5 (BIT) ×2 IMPLANT
DRSG MEPILEX BORDER 4X12 (GAUZE/BANDAGES/DRESSINGS) ×1 IMPLANT
DRSG MEPILEX BORDER 4X8 (GAUZE/BANDAGES/DRESSINGS) ×2 IMPLANT
DURAPREP 26ML APPLICATOR (WOUND CARE) ×2 IMPLANT
ELECT BLADE 4.0 EZ CLEAN MEGAD (MISCELLANEOUS) ×2
ELECT BLADE 6.5 EXT (BLADE) IMPLANT
ELECT REM PT RETURN 9FT ADLT (ELECTROSURGICAL) ×2
ELECTRODE BLDE 4.0 EZ CLN MEGD (MISCELLANEOUS) IMPLANT
ELECTRODE REM PT RTRN 9FT ADLT (ELECTROSURGICAL) ×1 IMPLANT
EVACUATOR 1/8 PVC DRAIN (DRAIN) IMPLANT
GAUZE XEROFORM 5X9 LF (GAUZE/BANDAGES/DRESSINGS) ×1 IMPLANT
GLOVE BIO SURGEON STRL SZ7 (GLOVE) ×2 IMPLANT
GLOVE BIO SURGEON STRL SZ7.5 (GLOVE) ×2 IMPLANT
GLOVE BIOGEL PI IND STRL 7.0 (GLOVE) ×1 IMPLANT
GLOVE BIOGEL PI IND STRL 8 (GLOVE) ×1 IMPLANT
GLOVE BIOGEL PI INDICATOR 7.0 (GLOVE) ×1
GLOVE BIOGEL PI INDICATOR 8 (GLOVE) ×1
GOWN PREVENTION PLUS XLARGE (GOWN DISPOSABLE) ×6 IMPLANT
GOWN STRL NON-REIN LRG LVL3 (GOWN DISPOSABLE) ×4 IMPLANT
HANDPIECE INTERPULSE COAX TIP (DISPOSABLE)
HEAD OSTEO CTAPER L FIT (Orthopedic Implant) ×2 IMPLANT
HEAD OSTEO CTAPER L FIT 25 +5 (Orthopedic Implant) IMPLANT
HEEL PROTECTOR  874200 (MISCELLANEOUS)
HEEL PROTECTOR 874200 (MISCELLANEOUS) IMPLANT
HOOD PEEL AWAY FACE SHEILD DIS (HOOD) ×4 IMPLANT
KIT BASIN OR (CUSTOM PROCEDURE TRAY) ×2 IMPLANT
KIT ROOM TURNOVER OR (KITS) ×2 IMPLANT
MANIFOLD NEPTUNE II (INSTRUMENTS) ×2 IMPLANT
NEEDLE 22X1 1/2 (OR ONLY) (NEEDLE) ×2 IMPLANT
NOZZLE PRISM 8.5MM (MISCELLANEOUS) IMPLANT
NS IRRIG 1000ML POUR BTL (IV SOLUTION) ×4 IMPLANT
PACK TOTAL JOINT (CUSTOM PROCEDURE TRAY) ×2 IMPLANT
PAD ARMBOARD 7.5X6 YLW CONV (MISCELLANEOUS) ×4 IMPLANT
PASSER SUT SWANSON 36MM LOOP (INSTRUMENTS) ×1 IMPLANT
PRESSURIZER FEMORAL UNIV (MISCELLANEOUS) IMPLANT
SCREW CORT ST 4.5X50 (Screw) IMPLANT
SCREW CORTEX ST 4.5X30 (Screw) IMPLANT
SET HNDPC FAN SPRY TIP SCT (DISPOSABLE) IMPLANT
SLEEVE SURGEON STRL (DRAPES) IMPLANT
SPONGE GAUZE 4X4 12PLY (GAUZE/BANDAGES/DRESSINGS) ×2 IMPLANT
SPONGE LAP 18X18 X RAY DECT (DISPOSABLE) IMPLANT
STAPLER VISISTAT 35W (STAPLE) ×2 IMPLANT
SUT ETHIBOND 2 V 37 (SUTURE) ×2 IMPLANT
SUT ETHILON 3 0 FSL (SUTURE) ×2 IMPLANT
SUT VIC AB 0 CTB1 27 (SUTURE) ×2 IMPLANT
SUT VIC AB 1 CTX 36 (SUTURE) ×2
SUT VIC AB 1 CTX36XBRD ANBCTR (SUTURE) ×1 IMPLANT
SUT VIC AB 2-0 CTB1 (SUTURE) ×2 IMPLANT
SYR 20ML ECCENTRIC (SYRINGE) ×2 IMPLANT
SYR CONTROL 10ML LL (SYRINGE) ×2 IMPLANT
TOWEL OR 17X24 6PK STRL BLUE (TOWEL DISPOSABLE) ×2 IMPLANT
TOWEL OR 17X26 10 PK STRL BLUE (TOWEL DISPOSABLE) ×2 IMPLANT
TOWER CARTRIDGE SMART MIX (DISPOSABLE) IMPLANT
TRAY FOLEY CATH 14FR (SET/KITS/TRAYS/PACK) ×1 IMPLANT
TUBE ANAEROBIC SPECIMEN COL (MISCELLANEOUS) ×2 IMPLANT
WATER STERILE IRR 1000ML POUR (IV SOLUTION) ×6 IMPLANT
acetabular insert 58mm constrained liner ×1 IMPLANT

## 2012-05-01 NOTE — Progress Notes (Signed)
Mepilex dressing to right hip saturated with bloody drainage (bed pad with significant amount of drainage noted to it).  Pt cleaned and dressing reinforced with 2 ABD pads and hypofix tape applied tightly to hip along with 2 ice packs to the area.  Continue to monitor drainage closely.

## 2012-05-01 NOTE — Anesthesia Procedure Notes (Signed)
Procedure Name: Intubation Date/Time: 05/01/2012 10:42 AM Performed by: Jerilee Hoh Pre-anesthesia Checklist: Patient identified, Emergency Drugs available, Patient being monitored, Suction available and Timeout performed Patient Re-evaluated:Patient Re-evaluated prior to inductionOxygen Delivery Method: Circle system utilized Preoxygenation: Pre-oxygenation with 100% oxygen Intubation Type: IV induction Ventilation: Mask ventilation without difficulty Laryngoscope Size: Mac and 3 Grade View: Grade II Tube type: Oral Tube size: 7.0 mm Number of attempts: 1 Airway Equipment and Method: Stylet Placement Confirmation: ETT inserted through vocal cords under direct vision,  positive ETCO2 and breath sounds checked- equal and bilateral Secured at: 22 cm Tube secured with: Tape Dental Injury: Teeth and Oropharynx as per pre-operative assessment

## 2012-05-01 NOTE — Op Note (Signed)
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 Revision to a 58 mm 10 constrained polyethylene liner on the acetabular side, +0 28 mm metal head on the femoral side  Surgeon: Feliberto Gottron. Turner Daniels M.D.  Assistant: Shirl Harris PA-C  Estimated blood loss: 300 cc  Fluid replacement: 1500 cc of crystalloid  Complications: None  Indications: Patient with repeated posterior dislocations of a hybrid Osteonics total hip that was placed originally in 1998. One day after that surgery she dislocated anteriorly and had some sort of an acetabular revision and did well for 14 years. The area she began to dislocate posteriorly and is had repetitive dislocations. Plain x-rays show cup that is slightly vertical at 64 but with a 10 liner that would translate to 54. There has been no change in the position of the components and the stem appears to be well ingrown. Overall the patient is relatively flexible and my impression is that she has multidirectional instability of her total hip that may require a constrained liner. Risks and benefits of revision surgery have been discussed and questions answered.  Procedure: Patient was identified by arm band receive preoperative IV antibiotics in the holding area at, and hospital. She was then taken to the operating room where the appropriate anesthetic monitors were attached and general endotracheal anesthesia induced with the patient in the supine position. She was then rolled into the left lateral decubitus position and fixed there with a mark 2 pelvic clamp. A Foley catheter was inserted and the limb prepped and draped in usual sterile fashion from the ankle to the hemipelvis. Time out procedure performed. Skin along the lateral hip and thigh infiltrated with 10 cc of 1/2% Marcaine and epinephrine solution. Prior to  incision we flex the hip to 90 almost 60 of internal rotation and it did not dislocate. In full extension with 30 of external rotation again the hip could not be dislocated. We began the operation by recreating the old posterior lateral incision 18 cm in line through the skin and subcutaneous tissue down to the level of the IT band which was cut in line with the skin incision. This exposed the posterior pseudocapsule which had a normal appearance. We developed an acetabular-based flap starting superiorly going out over the femoral neck and and exiting inferiorly. The joint fluid appeared normal and was sent for Gram stain and culture. We then remove scar tissue from around to the PSL cup and femoral stem trunnion, dislocated a total hip and removed the +1026 mm head with a mallet and metal cylinder. The trunnion was then tucked anterior and superior to the acetabulum and we continued to remove scar tissue from around the acetabular component. A posterior inferior wing retractor was hammered into place. This exposed the polyethylene liner and on examination the dislocation tract was posterior superior. We then drilled a hole into the polyethylene liner and screwed in a 4.5 mm Synthes screw removing the liner. The acetabular component appeared to be well ingrown with no evidence of loosening and no polyethylene disease. Prior to dislocating the femoral head we noted stability at 90 of flexion with 60 of internal rotation. This confirmed that the hip was geometrically stable, but the soft tissues were lax. We then selected a constrained 58 mm liner and hammered it into place with the index Mark posterior and superior for the 10 tilt. We then hammered  a +528 mm ball onto the trunnion and snapped the ball into place. Stability was now  noted to 90 of flexion 60 of internal rotation and in full extension the hip could externally rotate 30. The hip was now thoroughly irrigated with normal saline solution. The capsular  flap was repaired back to the intertrochanteric crest through drill holes with #2 Ethibond suture. We then closed the IT band with running #1 Vicryl suture, the subcutaneous tissue with 0 and 2-0 undyed Vicryl suture, the skin was closed with running interlocking 3-0 nylon suture. A dressing of Mepilex was then applied, the patient was unclamped a rolled supine awakened extubated and taken to the recovery without difficulty.

## 2012-05-01 NOTE — Progress Notes (Signed)
Orthopedic Tech Progress Note Patient Details:  Patricia [name] [birth year] [id number]  Patient ID: [name], female   DOB: 02-Jul-1941, 71 y.o.   MRN: [id number]   [name] 05/01/2012, 5:37 PM Trapeze bar

## 2012-05-01 NOTE — Anesthesia Postprocedure Evaluation (Signed)
  Anesthesia Post-op Note  Patient: BETHA SHADIX  Procedure(s) Performed: Procedure(s) (LRB): TOTAL HIP REVISION (Right)  Patient Location: PACU  Anesthesia Type: General  Level of Consciousness: awake, oriented, sedated and patient cooperative  Airway and Oxygen Therapy: Patient Spontanous Breathing and Patient connected to nasal cannula oxygen  Post-op Pain: mild  Post-op Assessment: Post-op Vital signs reviewed, Patient's Cardiovascular Status Stable, Respiratory Function Stable, Patent Airway, No signs of Nausea or vomiting and Pain level controlled  Post-op Vital Signs: stable  Complications: No apparent anesthesia complications

## 2012-05-01 NOTE — Preoperative (Signed)
Beta Blockers   Reason not to administer Beta Blockers:Not Applicable 

## 2012-05-01 NOTE — Transfer of Care (Signed)
Immediate Anesthesia Transfer of Care Note  Patient: Patricia [name]  Procedure(s) Performed: Procedure(s) (LRB): TOTAL HIP REVISION (Right)  Patient Location: PACU  Anesthesia Type: General  Level of Consciousness: awake, alert , oriented and patient cooperative  Airway & Oxygen Therapy: Patient Spontanous Breathing and Patient connected to nasal cannula oxygen  Post-op Assessment: Report given to PACU RN, Post -op Vital signs reviewed and stable and Patient moving all extremities  Post vital signs: Reviewed and stable  Complications: No apparent anesthesia complications

## 2012-05-01 NOTE — Interval H&P Note (Signed)
History and Physical Interval Note:  05/01/2012 9:51 AM  Patricia [name]  has presented today for surgery, with the diagnosis of RECURRENT DISLOCATION RIGHT TOTAL HIP  The various methods of treatment have been discussed with the patient and family. After consideration of risks, benefits and other options for treatment, the patient has consented to  Procedure(s) (LRB): TOTAL HIP REVISION (Right) as a surgical intervention .  The patient's history has been reviewed, patient examined, no change in status, stable for surgery.  I have reviewed the patient's chart and labs.  Questions were answered to the patient's satisfaction.     Nestor Lewandowsky

## 2012-05-01 NOTE — Plan of Care (Signed)
Problem: Consults Goal: Diagnosis- Total Joint Replacement Outcome: Completed/Met Date Met:  05/01/12 Revision Total Hip  Problem: Phase I Progression Outcomes Goal: Initial discharge plan identified Outcome: Completed/Met Date Met:  05/01/12 Plan is home with husband upon discharge.

## 2012-05-01 NOTE — Anesthesia Preprocedure Evaluation (Signed)
Anesthesia Evaluation  Patient identified by MRN, date of birth, ID band Patient awake    Reviewed: Allergy & Precautions, H&P , NPO status , Patient's Chart, lab work & pertinent test results  Airway Mallampati: I TM Distance: >3 FB Neck ROM: full    Dental   Pulmonary          Cardiovascular hypertension, Rhythm:regular Rate:Normal     Neuro/Psych    GI/Hepatic   Endo/Other    Renal/GU      Musculoskeletal   Abdominal   Peds  Hematology   Anesthesia Other Findings   Reproductive/Obstetrics                           Anesthesia Physical Anesthesia Plan  ASA: II  Anesthesia Plan: General   Post-op Pain Management:    Induction: Intravenous  Airway Management Planned: Oral ETT  Additional Equipment:   Intra-op Plan:   Post-operative Plan: Extubation in OR  Informed Consent: I have reviewed the patients History and Physical, chart, labs and discussed the procedure including the risks, benefits and alternatives for the proposed anesthesia with the patient or authorized representative who has indicated his/her understanding and acceptance.     Plan Discussed with: CRNA, Anesthesiologist and Surgeon  Anesthesia Plan Comments:         Anesthesia Quick Evaluation  

## 2012-05-02 ENCOUNTER — Encounter (HOSPITAL_COMMUNITY): Payer: Self-pay | Admitting: Orthopedic Surgery

## 2012-05-02 LAB — CBC
Platelets: 138 10*3/uL — ABNORMAL LOW (ref 150–400)
RBC: 3.12 MIL/uL — ABNORMAL LOW (ref 3.87–5.11)
RDW: 12.4 % (ref 11.5–15.5)
WBC: 4.9 10*3/uL (ref 4.0–10.5)

## 2012-05-02 LAB — BASIC METABOLIC PANEL
Chloride: 99 mEq/L (ref 96–112)
GFR calc Af Amer: >90 mL/min (ref >90)
Potassium: 4.1 mEq/L (ref 3.5–5.1)

## 2012-05-02 NOTE — Progress Notes (Signed)
UR COMPLETED  

## 2012-05-02 NOTE — Progress Notes (Addendum)
Physical Therapy Treatment Patient Details Name: Patricia [name] MRN: [id number] DOB: [birth year] Today's Date: 05/02/2012 Time: 1478-2956 PT Time Calculation (min): 32 min  PT Assessment / Plan / Recommendation Comments on Treatment Session  Pt tolerated treatment well. Patient demonstrates steady progress towards PT goals at this time. Patient successfully negotiated stairs without rails using rw as well as stairs with one hand rail. Pt will continue to benefit from skilled physical therapy to improve strength, activity tolerance, and reinforce HEP.      Follow Up Recommendations  Home health PT             Frequency 7X/week   Plan Discharge plan remains appropriate    Precautions / Restrictions Precautions Precautions: Posterior Hip Precaution Booklet Issued: Yes (comment) Precaution Comments: demonstrates good recall of hip precautions Restrictions Weight Bearing Restrictions: Yes RLE Weight Bearing: Weight bearing as tolerated   Pertinent Vitals/Pain 4/10    Mobility  Bed Mobility Bed Mobility: Supine to Sit;Sitting - Scoot to Edge of Bed Supine to Sit: 5: Supervision Sitting - Scoot to [place] of Bed: 5: Supervision Transfers Transfers: Sit to Stand;Stand to Sit Sit to Stand: 5: Supervision Stand to Sit: 5: Supervision Ambulation/Gait Ambulation/Gait Assistance: 5: Supervision Ambulation Distance (Feet): 300 Feet Assistive device: Rolling walker Gait velocity: decreased Stairs: Yes Stairs Assistance: 4: Min assist Stairs Assistance Details (indicate cue type and reason): Min assist for reverse method, min guard for stair negotiation with one rail only Stair Management Technique: Backwards;With walker Number of Stairs: 3     Exercises Total Joint Exercises Ankle Circles/Pumps: AROM;10 reps;Both Quad Sets: AAROM;Strengthening;10 reps;Right Long Arc Quad: AROM;Strengthening;Right;10 Ecologist in Standing: AROM;Strengthening;Right;10 reps Standing Hip Extension:  AROM;Strengthening;Right;10 reps    PT Goals Acute Rehab PT Goals PT Goal Formulation: With patient Time For Goal Achievement: 05/02/12 Potential to Achieve Goals: Good Pt will go Supine/Side to Sit: with modified independence PT Goal: Supine/Side to Sit - Progress: Progressing toward goal Pt will go Sit to Supine/Side: with modified independence PT Goal: Sit to Supine/Side - Progress: Progressing toward goal Pt will go Sit to Stand: with modified independence PT Goal: Sit to Stand - Progress: Progressing toward goal Pt will go Stand to Sit: with modified independence PT Goal: Stand to Sit - Progress: Progressing toward goal Pt will Ambulate: >150 feet;with modified independence;with rolling walker PT Goal: Ambulate - Progress: Progressing toward goal Pt will Go Up / Down Stairs: 3-5 stairs PT Goal: Up/Down Stairs - Progress: Met Pt will Perform Home Exercise Program: Independently PT Goal: Perform Home Exercise Program - Progress: Progressing toward goal  Visit Information  Last PT Received On: 05/02/12 Assistance Needed: +1    Subjective Data  Subjective: I would like to go for the stairs now if possible Patient Stated Goal: to go home   Cognition  Overall Cognitive Status: Appears within functional limits for tasks assessed/performed Arousal/Alertness: Awake/alert Orientation Level: Appears intact for tasks assessed;Oriented X4 / Intact Behavior During Session: [facility] for tasks performed    Balance     End of Session PT - End of Session Equipment Utilized During Treatment: Gait belt Activity Tolerance: Patient tolerated treatment well Patient left: in chair;with call bell/phone within reach Nurse Communication: Mobility status   GP     Fabio Asa 05/02/2012, 3:43 PM Charlotte Crumb, PT DPT  [phone]

## 2012-05-02 NOTE — Evaluation (Signed)
Physical Therapy Evaluation Patient Details Name: Patricia [name] MRN: [id number] DOB: 1940-12-18 Today's Date: 05/02/2012 Time: 4098-1191 PT Time Calculation (min): 27 min  PT Assessment / Plan / Recommendation Clinical Impression  Pt is a 71 y/o female s/p R THA.  Patient demonstrates deficits in functional mobility secondary to pain and precautions.  Pt will continue to benefit from skilled physical therapy to improve strength, mobility, and overall functional performance.  Pt able to recall precautions with minimal prompting. Rec. home PT upon discharge.    PT Assessment  Patient needs continued PT services    Follow Up Recommendations  Home health PT             Frequency 7X/week    Precautions / Restrictions Precautions Precautions: Posterior Hip Precaution Booklet Issued: Yes (comment) Precaution Comments: demonstrates good recall of hip precautions Restrictions Weight Bearing Restrictions: Yes RLE Weight Bearing: Weight bearing as tolerated   Pertinent Vitals/Pain 5/10      Mobility  Transfers Transfers: Sit to Stand;Stand to Sit Sit to Stand: 4: Min guard Stand to Sit: 4: Min guard Details for Transfer Assistance: VCs for hand placement  Ambulation/Gait Ambulation/Gait Assistance: 4: Min guard Ambulation Distance (Feet): 150 Feet Assistive device: Rolling walker Ambulation/Gait Assistance Details: VCs for sequencing initially with good recall Gait Pattern: Step-through pattern;Step-to pattern;Decreased stride length;Antalgic Gait velocity: decreased General Gait Details: VCs for step through gait        PT Diagnosis: Difficulty walking;Abnormality of gait;Generalized weakness;Acute pain  PT Problem List: Decreased strength;Decreased range of motion;Decreased activity tolerance;Decreased mobility;Decreased knowledge of use of DME;Pain PT Treatment Interventions: DME instruction;Gait training;Stair training;Functional mobility training;Therapeutic  activities;Therapeutic exercise;Patient/family education   PT Goals Acute Rehab PT Goals PT Goal Formulation: With patient Time For Goal Achievement: 05/02/12 Potential to Achieve Goals: Good Pt will go Supine/Side to Sit: with modified independence PT Goal: Supine/Side to Sit - Progress: Goal set today Pt will go Sit to Supine/Side: with modified independence PT Goal: Sit to Supine/Side - Progress: Goal set today Pt will go Sit to Stand: with modified independence PT Goal: Sit to Stand - Progress: Goal set today Pt will go Stand to Sit: with modified independence PT Goal: Stand to Sit - Progress: Goal set today Pt will Ambulate: >150 feet;with modified independence;with rolling walker PT Goal: Ambulate - Progress: Goal set today Pt will Go Up / Down Stairs: 3-5 stairs PT Goal: Up/Down Stairs - Progress: Goal set today Pt will Perform Home Exercise Program: Independently PT Goal: Perform Home Exercise Program - Progress: Goal set today  Visit Information  Last PT Received On: 05/02/12 Assistance Needed: +1    Subjective Data  Subjective: I am feeling good this morning and ready to go Patient Stated Goal: to go home   Prior Functioning  Home Living Lives With: Spouse Available Help at Discharge: Family Type of Home: House Home Layout: Multi-level Alternate Level Stairs-Number of Steps: 14 Alternate Level Stairs-Rails: Right Bathroom Shower/Tub: Engineer, manufacturing systems: Standard Home Adaptive Equipment: Bedside commode/3-in-1;Crutches;Walker - rolling Prior Function Level of Independence: Independent Able to Take Stairs?: Yes Driving: Yes Vocation: Full time 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 for  tasks assessed LLE Sensation: WFL - Proprioception;WFL - Light Touch LLE Coordination: WFL - gross motor   Balance    End of Session PT - End of Session Equipment Utilized During Treatment: Gait belt Activity Tolerance: Patient tolerated treatment well Patient left: in chair;with call bell/phone within reach Nurse Communication: Mobility status  GP     Fabio Asa 05/02/2012, 11:40 AM Charlotte Crumb, PT DPT  [phone]

## 2012-05-02 NOTE — Care Management Note (Signed)
    Page 1 of 1   05/02/2012     10:40:09 AM   CARE MANAGEMENT NOTE 05/02/2012  Patient:  Patricia [name], Patricia [name]   Account Number:  [id number]  Date Initiated:  05/02/2012  Documentation initiated by:  Anette Guarneri  Subjective/Objective Assessment:   POD#1 s/p right THA revision  will need hh services  per patient has DME     Action/Plan:   home with hh services   Anticipated DC Date:  05/04/2012   Anticipated DC Plan:  HOME W HOME HEALTH SERVICES      DC Planning Services  CM consult      Choice offered to / List presented to:             Status of service:  Completed, signed off Medicare Important Message given?  NO (If response is "NO", the following Medicare IM given date fields will be blank) Date Medicare IM given:   Date Additional Medicare IM given:    Discharge Disposition:  HOME W HOME HEALTH SERVICES  Per UR Regulation:  Reviewed for med. necessity/level of care/duration of stay  If discussed at Long Length of Stay Meetings, dates discussed:    Comments:  05/02/12  10:35  Anette Guarneri RN/CM patient plans to d/c home with husband, has RW/BSC/Shower chair MD office has pre-arranged for [facility] services with [place].

## 2012-05-02 NOTE — Progress Notes (Signed)
Patient ID: Patricia [name], female   DOB: 1941/06/19, 71 y.o.   MRN: [id number] PATIENT ID: IRIDIAN READER  MRN: [id number]  DOB/AGE:  01/17/1941 / 71 y.o.  1 Day Post-Op Procedure(s) (LRB): TOTAL HIP REVISION (Right)    PROGRESS NOTE Subjective: Patient is alert, oriented,no Nausea, no Vomiting, yes passing gas, no Bowel Movement. Taking PO patient ate all of her breakfast. Denies SOB, Chest or Calf Pain. Using Incentive Spirometer, PAS in place. Ambulate weight bearing as tolerated today Patient reports pain as 2 on 0-10 scale  .    Objective: Vital signs in last 24 hours: Filed Vitals:   05/02/12 0011 05/02/12 0141 05/02/12 0345 05/02/12 0603  BP:  108/58  104/56  Pulse:  64  60  Temp:  97.5 F (36.4 C)  98.1 F (36.7 C)  TempSrc:  Oral  Oral  Resp: 18 18 18 18   Height:      Weight:      SpO2: 100% 100% 97% 99%      Intake/Output from previous day: I/O last 3 completed shifts: In: 2305 [P.O.:480; I.V.:1625; IV Piggyback:200] Out: 2125 [Urine:1825; Blood:300]   Intake/Output this shift:     LABORATORY DATA:  Basename 05/02/12 0620  WBC 4.9  HGB 9.9*  HCT 29.2*  PLT 138*  NA --  K --  CL --  CO2 --  BUN --  CREATININE --  GLUCOSE --  GLUCAP --  INR --  CALCIUM --    Examination: Neurologically intact ABD soft Neurovascular intact Sensation intact distally Intact pulses distally Dorsiflexion/Plantar flexion intact Incision: moderate drainage No cellulitis present Compartment soft} XR AP&Lat of hip shows well placed\fixed THA, constrained liner is in good position.  Assessment:   1 Day Post-Op Procedure(s) (LRB): TOTAL HIP REVISION (Right) ADDITIONAL DIAGNOSIS:   Plan: PT/OT WBAT, THA  posterior precautions, changed Mepilex dressing today plan for discharge tomorrow.  DVT Prophylaxis: SCDx72 hrs, ASA 325 mg BID x 2 weeks  DISCHARGE PLAN: Home  DISCHARGE NEEDS: HHPT, HHRN, CPM, Walker and 3-in-1 comode seat

## 2012-05-03 DIAGNOSIS — T84018A Broken internal joint prosthesis, other site, initial encounter: Secondary | ICD-10-CM

## 2012-05-03 LAB — CBC
HCT: 29.3 % — ABNORMAL LOW (ref 36.0–46.0)
Hemoglobin: 10.1 g/dL — ABNORMAL LOW (ref 12.0–15.0)
RBC: 3.1 MIL/uL — ABNORMAL LOW (ref 3.87–5.11)
WBC: 7 10*3/uL (ref 4.0–10.5)

## 2012-05-03 MED ORDER — ASPIRIN 325 MG PO TBEC: 325.0000 mg | DELAYED_RELEASE_TABLET | Freq: Two times a day (BID) | ORAL | Status: AC

## 2012-05-03 MED ORDER — METHOCARBAMOL 500 MG PO TABS: 500.0000 mg | ORAL_TABLET | Freq: Four times a day (QID) | ORAL | Status: AC | PRN

## 2012-05-03 MED ORDER — OXYCODONE-ACETAMINOPHEN 5-325 MG PO TABS
1.0000 | ORAL_TABLET | ORAL | Status: AC | PRN
Start: 2012-05-03 — End: 2012-05-13

## 2012-05-03 NOTE — Progress Notes (Signed)
PATIENT ID: Patricia [name]  MRN: [id number]  DOB/AGE:  Jan 31, 1941 / 71 y.o.  2 Days Post-Op Procedure(s) (LRB): TOTAL HIP REVISION (Right)    PROGRESS NOTE Subjective: Patient is alert, oriented,no Nausea, no Vomiting, yes passing gas, no Bowel Movement. Taking PO well. Denies SOB, Chest or Calf Pain. Using Incentive Spirometer, PAS in place. Ambulating well with PT. Patient reports pain as moderate  .    Objective: Vital signs in last 24 hours: Filed Vitals:   05/02/12 0345 05/02/12 0603 05/02/12 2247 05/03/12 0617  BP:  104/56 148/60 145/62  Pulse:  60 88 85  Temp:  98.1 F (36.7 C) 98.9 F (37.2 C) 98.7 F (37.1 C)  TempSrc:  Oral    Resp: 18 18 18 18   Height:      Weight:      SpO2: 97% 99% 100% 99%      Intake/Output from previous day: I/O last 3 completed shifts: In: 460 [P.O.:360; IV Piggyback:100] Out: 1100 [Urine:1100]   Intake/Output this shift:     LABORATORY DATA:  Basename 05/03/12 0555 05/02/12 0620  WBC 7.0 4.9  HGB 10.1* 9.9*  HCT 29.3* 29.2*  PLT 151 138*  NA -- 131*  K -- 4.1  CL -- 99  CO2 -- 26  BUN -- 7  CREATININE -- 0.68  GLUCOSE -- 110*  GLUCAP -- --  INR -- --  CALCIUM -- 9.1    Examination: Neurologically intact ABD soft Neurovascular intact Sensation intact distally Intact pulses distally Dorsiflexion/Plantar flexion intact Incision: scant drainage} XR AP&Lat of hip shows well placed\fixed THA  Assessment:   2 Days Post-Op Procedure(s) (LRB): TOTAL HIP REVISION (Right) ADDITIONAL DIAGNOSIS:  none  Plan: PT/OT WBAT, THA  posterior precautions  DVT Prophylaxis: SCDx72 hrs, ASA 325 mg BID x 2 weeks  DISCHARGE PLAN: Home today  DISCHARGE NEEDS: HHPT, HHRN, Walker and 3-in-1 comode seat

## 2012-05-03 NOTE — Progress Notes (Signed)
Physical Therapy Treatment Patient Details Name: Patricia [name] MRN: [id number] DOB: 1941-02-12 Today's Date: 05/03/2012 Time: 1914-7829 PT Time Calculation (min): 32 min  PT Assessment / Plan / Recommendation Comments on Treatment Session  Pt tolerated treatment well.  Patient demonstrates good understanding of HEP and use of assistive devices.      Follow Up Recommendations  Home health PT    Barriers to Discharge        Equipment Recommendations       Recommendations for Other Services    Frequency 7X/week   Plan Discharge plan remains appropriate    Precautions / Restrictions Precautions Precautions: Posterior Hip Precaution Booklet Issued: Yes (comment) Precaution Comments: demonstrates good recall of hip precautions Restrictions Weight Bearing Restrictions: Yes RLE Weight Bearing: Weight bearing as tolerated   Pertinent Vitals/Pain 4/10    Mobility  Transfers Transfers: Sit to Stand;Stand to Sit Sit to Stand: 6: Modified independent (Device/Increase time) Stand to Sit: 6: Modified independent (Device/Increase time) Ambulation/Gait Ambulation/Gait Assistance: 6: Modified independent (Device/Increase time) Ambulation Distance (Feet): 310 Feet Assistive device: Rolling walker;Crutches Ambulation/Gait Assistance Details: Patient ambulated 280 with rw and 30 with crutches Gait Pattern: Step-through pattern Gait velocity: normal pace General Gait Details: Patient able to ambulate with both assistive devices without difficulty    Exercises Total Joint Exercises Ankle Circles/Pumps: AROM;10 reps;Both Quad Sets: AAROM;Strengthening;10 reps;Right Marching in Standing: AROM;Strengthening;Right;10 reps Standing Hip Extension: AROM;Strengthening;Right;10 reps   PT Diagnosis:    PT Problem List:   PT Treatment Interventions:     PT Goals Acute Rehab PT Goals PT Goal Formulation: With patient Time For Goal Achievement: 05/02/12 Potential to Achieve Goals: Good Pt  will go Sit to Stand: with modified independence PT Goal: Sit to Stand - Progress: Met Pt will go Stand to Sit: with modified independence PT Goal: Stand to Sit - Progress: Met Pt will Ambulate: >150 feet;with modified independence;with rolling walker PT Goal: Ambulate - Progress: Met Pt will Perform Home Exercise Program: Independently PT Goal: Perform Home Exercise Program - Progress: Progressing toward goal  Visit Information  Last PT Received On: 05/03/12 Assistance Needed: +1    Subjective Data  Subjective: I am a little stiff and sore today Patient Stated Goal: to go home   Cognition  Overall Cognitive Status: Appears within functional limits for tasks assessed/performed Arousal/Alertness: Awake/alert Orientation Level: Appears intact for tasks assessed;Oriented X4 / Intact Behavior During Session: [facility] for tasks performed       End of Session PT - End of Session Equipment Utilized During Treatment: Gait belt Activity Tolerance: Patient tolerated treatment well Patient left: in chair;with call bell/phone within reach Nurse Communication: Mobility status   GP     Fabio Asa 05/03/2012, 10:37 AM Charlotte Crumb, PT DPT  [phone]

## 2012-05-03 NOTE — Progress Notes (Signed)
PT PROGRESS NOTE   05/03/12 1302  PT Visit Information  Last PT Received On 05/03/12  Assistance Needed +1  PT Time Calculation  PT Start Time 1206  PT Stop Time 1222  PT Time Calculation (min) 16 min  Subjective Data  Subjective Im ready to finish up  Patient Stated Goal to go home  Precautions  Precautions Posterior Hip  Precaution Booklet Issued Yes (comment)  Precaution Comments demonstrates good recall of hip precautions  Restrictions  Weight Bearing Restrictions Yes  RLE Weight Bearing WBAT  Cognition  Overall Cognitive Status Appears within functional limits for tasks assessed/performed  Arousal/Alertness Awake/alert  Orientation Level Appears intact for tasks assessed;Oriented X4 / Intact  Behavior During Session [facility] for tasks performed  Transfers  Transfers Sit to Stand;Stand to Sit  Sit to Stand 6: Modified independent (Device/Increase time)  Stand to Sit 6: Modified independent (Device/Increase time)  Ambulation/Gait  Ambulation/Gait Assistance 6: Modified independent (Device/Increase time)  Ambulation Distance (Feet) 300 Feet  Assistive device Crutches  Gait Pattern Step-through pattern  Gait velocity normal pace  Stairs Yes  Stairs Assistance 6: Modified independent (Device/Increase time)  Stair Management Technique No rails;With crutches  Number of Stairs 3   PT - End of Session  Equipment Utilized During Treatment Gait belt  Activity Tolerance Patient tolerated treatment well  Patient left in chair;with call bell/phone within reach;with family/visitor present  Nurse Communication Mobility status  PT - Assessment/Plan  Comments on Treatment Session Pt safe for d/c home  PT Plan Discharge plan remains appropriate  PT Frequency 7X/week  Follow Up Recommendations Home health PT  Acute Rehab PT Goals  PT Goal Formulation With patient  Time For Goal Achievement 05/02/12  Potential to Achieve Goals Good  Pt will go Sit to Stand with modified independence    PT Goal: Sit to Stand - Progress Met  Pt will go Stand to Sit with modified independence  PT Goal: Stand to Sit - Progress Met  Pt will Ambulate >150 feet;with modified independence;with rolling walker  PT Goal: Ambulate - Progress Met  Pt will Perform Home Exercise Program Independently  PT Goal: Perform Home Exercise Program - Progress Met  PT General Charges  $$ ACUTE PT VISIT 1 Procedure  PT Treatments  $Gait Training 8-22 mins   Charlotte Crumb, PT DPT  [phone]

## 2012-05-03 NOTE — Discharge Summary (Signed)
Patient ID: Patricia [name] MRN: [id number] DOB/AGE: [birth year] 71 y.o.  Admit date: 05/01/2012 Discharge date: 05/03/2012  Admission Diagnoses:  Active Problems:  Failed total hip arthroplasty   Discharge Diagnoses:  Same  Past Medical History  Diagnosis Date  . Hypertension     borderline but no meds yet  . Arthritis     Surgeries: Procedure(s): TOTAL HIP REVISION on 05/01/2012   Consultants:    Discharged Condition: Improved  Hospital Course: Patricia [name] is an 71 y.o. female who was admitted 05/01/2012 for operative treatment of<principal problem not specified>. Patient has severe unremitting pain that affects sleep, daily activities, and work/hobbies. After pre-op clearance the patient was taken to the operating room on 05/01/2012 and underwent  Procedure(s): TOTAL HIP REVISION.    Patient was given perioperative antibiotics: Anti-infectives     Start     Dose/Rate Route Frequency Ordered Stop   04/30/12 1346   ceFAZolin (ANCEF) IVPB 2 g/50 mL premix        2 g 100 mL/hr over 30 Minutes Intravenous 60 min pre-op 04/30/12 1346 05/01/12 1044           Patient was given sequential compression devices, early ambulation, and chemoprophylaxis to prevent DVT.  Patient benefited maximally from hospital stay and there were no complications.    Recent vital signs: Patient Vitals for the past 24 hrs:  BP Temp Pulse Resp SpO2  05/03/12 0617 145/62 mmHg 98.7 F (37.1 C) 85  18  99 %  2012-05-22 2247 148/60 mmHg 98.9 F (37.2 C) 88  18  100 %     Recent laboratory studies:  Basename 05/03/12 0555 05-22-2012 0620  WBC 7.0 4.9  HGB 10.1* 9.9*  HCT 29.3* 29.2*  PLT 151 138*  NA -- 131*  K -- 4.1  CL -- 99  CO2 -- 26  BUN -- 7  CREATININE -- 0.68  GLUCOSE -- 110*  INR -- --  CALCIUM -- 9.1     Discharge Medications:   Medication List  As of 05/03/2012  8:18 AM   TAKE these medications         aspirin 325 MG EC tablet   Take 1 tablet (325 mg total) by mouth 2 (two)  times daily.      ibuprofen 200 MG tablet   Commonly known as: ADVIL,MOTRIN   Take 600 mg by mouth every 6 (six) hours as needed. For pain      methocarbamol 500 MG tablet   Commonly known as: ROBAXIN   Take 1 tablet (500 mg total) by mouth every 6 (six) hours as needed.      oxyCODONE-acetaminophen 5-325 MG per tablet   Commonly known as: PERCOCET/ROXICET   Take 1-2 tablets by mouth every 4 (four) hours as needed for pain.            Diagnostic Studies: Dg Chest 2 View  04/27/2012  *RADIOLOGY REPORT*  Clinical Data: Preop for hip surgery.  CHEST - 2 VIEW  Comparison: 11/12/2011  Findings: Normal heart size.  Stable mediastinal and hilar contours.  Midline trachea.  Slight pleuroparenchymal scarring at the lung apices is stable.  The lungs are normally expanded and clear otherwise.  No airspace disease, pleural effusion, or pneumothorax.  No acute bony abnormality identified.  IMPRESSION: No acute cardiopulmonary disease.  Original Report Authenticated By: Britta Mccreedy, M.D.   Dg Pelvis Portable  05/01/2012  *RADIOLOGY REPORT*  Clinical Data: Postoperative radiograph.  Right hip arthroplasty.  PORTABLE PELVIS  Comparison: 03/29/2012.  Findings: There has been revision of the right total hip arthroplasty.   There is a new extended polyethylene liner of the acetabular component with retention ring.  The cemented femoral stem appears unchanged. Interval revision of the prosthetic right femoral head.  Expected changes in the soft tissues of the right hip. Left total hip arthroplasty appears unchanged.  IMPRESSION: Revision right total hip arthroplasty.  Original Report Authenticated By: Andreas Newport, M.D.   Dg Hip Portable 1 View Right  05/01/2012  *RADIOLOGY REPORT*  Clinical Data: Postoperative radiographs of the right hip following revision right total hip arthroplasty.  PORTABLE RIGHT HIP - 1 VIEW  Comparison: 03/29/2012.  Findings: Revision of the right total hip arthroplasty with extended  polyethylene spacer and retention ring.  Revision of the prosthetic femoral head is well although the femoral stem appears unchanged.  The hip is located.  IMPRESSION: Uncomplicated revision right total hip arthroplasty.  Original Report Authenticated By: Andreas Newport, M.D.    Disposition: 01-Home or Self Care  Discharge Orders    Future Orders Please Complete By Expires   Increase activity slowly      Walker       May shower / Bathe      Driving Restrictions      Comments:   No driving for 2 weeks.   Change dressing (specify)      Comments:   Dressing change as needed.   Call MD for:  temperature >100.4      Call MD for:  severe uncontrolled pain      Call MD for:  redness, tenderness, or signs of infection (pain, swelling, redness, odor or green/yellow discharge around incision site)      Discharge instructions      Comments:   F/U with Dr. Turner Daniels as scheduled.         SignedHazle Nordmann. 05/03/2012, 8:18 AM

## 2012-05-03 NOTE — Progress Notes (Signed)
Pt. discharge to floor,verbalized understanding of discharged instruction,medication,restriction,diet and follow up appointment.Baseline Vitals sign stable,Pt comfortable,no sign and symptom of distress. 

## 2012-05-05 LAB — BODY FLUID CULTURE

## 2012-05-06 LAB — ANAEROBIC CULTURE: Gram Stain: NONE SEEN

## 2012-11-15 IMAGING — CR DG HIP 1V PORT*R*
1 series · 1 of 1 positions shown · non-contrast
Comparison: 03/29/2012.

CLINICAL DATA: Postoperative radiographs of the right hip following
revision right total hip arthroplasty.

PORTABLE RIGHT HIP - 1 VIEW

[AP]
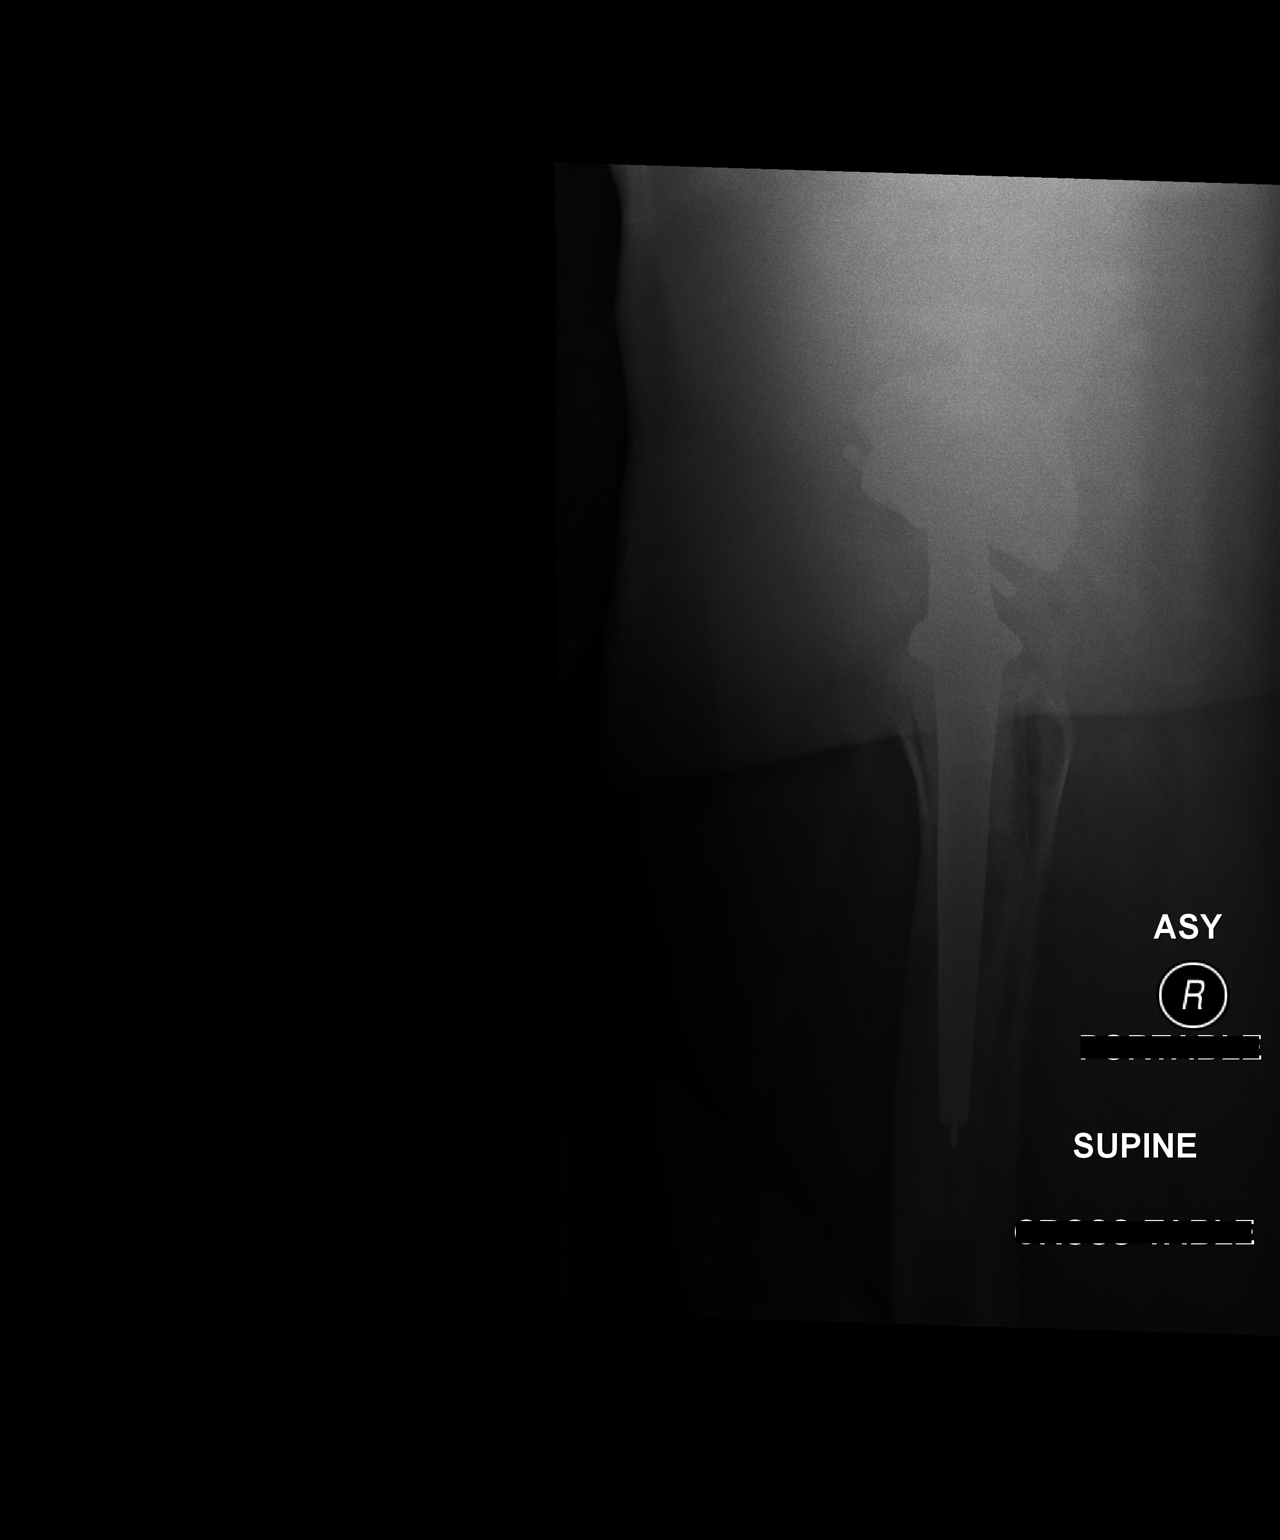

[1 of 1 positions shown; findings below may reference images not displayed]

FINDINGS: Revision of the right total hip arthroplasty with
extended polyethylene spacer and retention ring.  Revision of the
prosthetic femoral head is well although the femoral stem appears
unchanged.  The hip is located.
IMPRESSION: Uncomplicated revision right total hip arthroplasty.

## 2014-09-12 ENCOUNTER — Ambulatory Visit (INDEPENDENT_AMBULATORY_CARE_PROVIDER_SITE_OTHER): Payer: Medicare Other

## 2014-09-12 DIAGNOSIS — R52 Pain, unspecified: Secondary | ICD-10-CM

## 2014-09-12 DIAGNOSIS — B351 Tinea unguium: Secondary | ICD-10-CM

## 2014-09-12 DIAGNOSIS — M2041 Other hammer toe(s) (acquired), right foot: Secondary | ICD-10-CM

## 2014-09-12 DIAGNOSIS — M2021 Hallux rigidus, right foot: Secondary | ICD-10-CM

## 2014-09-12 DIAGNOSIS — M19079 Primary osteoarthritis, unspecified ankle and foot: Secondary | ICD-10-CM

## 2014-09-12 DIAGNOSIS — M1990 Unspecified osteoarthritis, unspecified site: Secondary | ICD-10-CM

## 2014-09-12 DIAGNOSIS — M898X9 Other specified disorders of bone, unspecified site: Secondary | ICD-10-CM

## 2014-09-12 NOTE — Patient Instructions (Signed)
Pre-Operative Instructions  Congratulations, you have decided to take an important step to improving your quality of life.  You can be assured that the doctors of [facility] will be with you every step of the way.  1. Plan to be at the surgery center/hospital at least 1 (one) hour prior to your scheduled time unless otherwise directed by the surgical center/hospital staff.  You must have a responsible adult accompany you, remain during the surgery and drive you home.  Make sure you have directions to the surgical center/hospital and know how to get there on time. 2. For hospital based surgery you will need to obtain a history and physical form from your family physician within 1 month prior to the date of surgery- we will give you a form for you primary physician.  3. We make every effort to accommodate the date you request for surgery.  There are however, times where surgery dates or times have to be moved.  We will contact you as soon as possible if a change in schedule is required.   4. No Aspirin/Ibuprofen for one week before surgery.  If you are on aspirin, any non-steroidal anti-inflammatory medications (Mobic, Aleve, Ibuprofen) you should stop taking it 7 days prior to your surgery.  You make take Tylenol  For pain prior to surgery.  5. Medications- If you are taking daily heart and blood pressure medications, seizure, reflux, allergy, asthma, anxiety, pain or diabetes medications, make sure the surgery center/hospital is aware before the day of surgery so they may notify you which medications to take or avoid the day of surgery. 6. No food or drink after midnight the night before surgery unless directed otherwise by surgical center/hospital staff. 7. No alcoholic beverages 24 hours prior to surgery.  No smoking 24 hours prior to or 24 hours after surgery. 8. Wear loose pants or shorts- loose enough to fit over bandages, boots, and casts. 9. No slip on shoes, sneakers are best. 10. Bring  your boot with you to the surgery center/hospital.  Also bring crutches or a walker if your physician has prescribed it for you.  If you do not have this equipment, it will be provided for you after surgery. 11. If you have not been contracted by the surgery center/hospital by the day before your surgery, call to confirm the date and time of your surgery. 12. Leave-time from work may vary depending on the type of surgery you have.  Appropriate arrangements should be made prior to surgery with your employer. 13. Prescriptions will be provided immediately following surgery by your doctor.  Have these filled as soon as possible after surgery and take the medication as directed. 14. Remove nail polish on the operative foot. 15. Wash the night before surgery.  The night before surgery wash the foot and leg well with the antibacterial soap provided and water paying special attention to beneath the toenails and in between the toes.  Rinse thoroughly with water and dry well with a towel.  Perform this wash unless told not to do so by your physician.  Enclosed: 1 Ice pack (please put in freezer the night before surgery)   1 Hibiclens skin cleaner   Pre-op Instructions  If you have any questions regarding the instructions, do not hesitate to call our office.  [place]: [address] [place], [place] [address] [phone]  [place]: [address] [place], [place] [address] [phone]  [place]: [address]  [place], [place] [address] [phone]  Dr. Cherelle Midkiff   Tuchman DPM, Dr. Norman Regal DPM Dr. Mahlia Fernando DPM, Dr. M. Todd Hyatt DPM, Dr. Kathryn Egerton DPM 

## 2014-09-12 NOTE — Progress Notes (Signed)
Subjective:    Patient ID: Patricia [name], female    DOB: 08/07/1941, 73 y.o.   MRN: [id number]  HPI  PT STATED RT FOOT 2ND TOE IS A LITTLE SORE AND HAVE A LITTLE DRANIGE. THE TOE IS GETTING WORSE AND GET AGGRAVATED BY WEARING SHOES. TRIED NO TREATMENT.  ALSO, CHECK B/L GREAT TOENAIL HAVE DISCOLORATION.   Review of Systems  All other systems reviewed and are negative.      Objective:   Physical Exam 73 year old female well-developed well-nourished oriented 3 presents this time with 2 concerns 1 she has a painful lesion of her second toe right foot the toes crooked and rubbing up against her shoes been like that for several months or more than a year's history getting worse recently had some irritation to the point of some bleeding or blistering. That is otherwise intact pedal pulses palpable DP and PT +2 over 4 Refill time 3 seconds all digits epicritic and proprioceptive sensations intact and symmetric there is normal plantar response and DTRs. Dermatologically skin color pigment normal hair growth absent nails unremarkable except for thickening brittleness and discoloration of the hallux nails bilateral the past patient is tried treating with topical antifungal with minimal success she failed follow-through for more than a couple of applications. May consider revisiting a topical antifungal treatment at this time. However for an orthopedic biomechanical exam there is notable hammertoe deformity of the excoriation over the IP joint second toe right foot rigid contracture toe noted clinically and radiographically. There is a history of contusion or trauma to the toe which may be contributing to the hammertoe deformity in arthropathy. There is also palpable exostosis over the first metatarsal head and neck right which also is visible on x-ray is a large osteophyte dorsal first metatarsal there is some slight crepitus asymmetric joint space narrowing first MTP joint on palpation range of motion and  on x-ray. Under the exam otherwise unremarkable      Assessment & Plan:  Assessment #1 onychomycosis of hallux nails discussed options for treatment however after evaluating patient is allergic to tobramycin the new topical antifungals are contraindicated due to the allergy issues or cross reaction allergy patient will be advised to maintain OTC Fungi-Nail twice daily for a 12 month duration as recommended as alternative will avoid other meds to potential side effect and the allergy complication.  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 Along with the hammertoe repair patient also benefit from exostectomy or cheilectomy first metatarsal head of the right foot where a large osteophyte arthritics bone spur identified. This time the consent form for hammertoe repair second and cheilectomy and excess mastectomy first metatarsal right foot are all reviewed and signed all questions asked medication are answered there indications and surgery rescheduled her convenience she will be able to return to activities and it care of her husband within a few days understands that for a few days she will be on pain medications and moderate are limited standing walking no bending stooping or lifting. Consent form is reviewed and signed surgery scheduled at this time patient will  be called and advised to not expect a pharmacy delivery of oral and have her topical oral antifungal we'll switch over to OTC Fungi-Nail due to allergy issues. Next  Alvan Dameichard Alona Danford DPM

## 2014-10-23 DIAGNOSIS — M13879 Other specified arthritis, unspecified ankle and foot: Secondary | ICD-10-CM

## 2014-10-23 DIAGNOSIS — M2021 Hallux rigidus, right foot: Secondary | ICD-10-CM

## 2014-10-23 DIAGNOSIS — M2041 Other hammer toe(s) (acquired), right foot: Secondary | ICD-10-CM

## 2014-10-23 DIAGNOSIS — M25774 Osteophyte, right foot: Secondary | ICD-10-CM

## 2014-10-31 ENCOUNTER — Ambulatory Visit (INDEPENDENT_AMBULATORY_CARE_PROVIDER_SITE_OTHER): Payer: Medicare Other

## 2014-10-31 DIAGNOSIS — M2041 Other hammer toe(s) (acquired), right foot: Secondary | ICD-10-CM

## 2014-10-31 DIAGNOSIS — Z09 Encounter for follow-up examination after completed treatment for conditions other than malignant neoplasm: Secondary | ICD-10-CM

## 2014-10-31 DIAGNOSIS — M2021 Hallux rigidus, right foot: Secondary | ICD-10-CM

## 2014-10-31 NOTE — Progress Notes (Signed)
   Subjective:    Patient ID: Patricia [name], female    DOB: [birth year], 74 y.o.   MRN: [id number]  HPI  DOS 10/23/14 CHEILECTOMY, HAMMERTOE RT.  ''RT FOOT IS DOING OK.''  Review of Systems no new findings or systemic changes noted     Objective:   Physical Exam Neurovascular status is intact pedal pulses are palpable epicritic and proprioceptive sensations intact patient is a day status post a first MTP area right and hammertoe repair second right doing well minimal pain or discomfort minimal edema minimal ecchymosis dressings intact and dry. X-rays reveal good position alignment of the second toe with K wire 10 in intact fixation the hallux is rectus with absence of spurring on the dorsal metatarsal and lateral projection. Mild ecchymosis and edema consistent with postop course.       Assessment & Plan:  Assessment good postop progress plan at this time presto compressive dressing reapplied reappoint: One week plan for suture removal and second toe next week maintain air fracture can boot at all times as instructed and didn't instructed in range of motion exercises great toe elevated and ice when possible to help with swelling. Assessment otherwise good progress noted patient will contact us any changes or exacerbations occur    Patricia [name] DPM

## 2014-10-31 NOTE — Patient Instructions (Signed)

## 2014-11-07 ENCOUNTER — Ambulatory Visit (INDEPENDENT_AMBULATORY_CARE_PROVIDER_SITE_OTHER): Payer: Medicare Other

## 2014-11-07 VITALS — BP 110/64 | HR 87 | Resp 12

## 2014-11-07 DIAGNOSIS — Z09 Encounter for follow-up examination after completed treatment for conditions other than malignant neoplasm: Secondary | ICD-10-CM

## 2014-11-07 DIAGNOSIS — M898X9 Other specified disorders of bone, unspecified site: Secondary | ICD-10-CM

## 2014-11-07 DIAGNOSIS — M2041 Other hammer toe(s) (acquired), right foot: Secondary | ICD-10-CM

## 2014-11-07 NOTE — Progress Notes (Signed)
   Subjective:    Patient ID: Patricia [name], female    DOB: Dec 17, 1940, 74 y.o.   MRN: [id number]  HPI DOS 10/23/14 CHEILECTOMY, HAMMERTOE RT.  ''FOOT IS DOING OK.''   Review of Systems no new findings or systemic changes noted     Objective:   Physical Exam Patient is two-week status post tarsal ostectomy, K first MTP area right as well as hammertoe repair second right. Sutures removed at this time of the second toe incision clean dry well coapted dressings intact and dry. Coflex wrapping of the toe buddy wrapping second and third toes is carried out at this time patient may resume normal bathing and hygiene continue with active and passive range of motion exercises on the great toe joint. Compression stocking or ankle is dispensed maintain air fracture boot for at least 3 more weeks.       Assessment & Plan:  Assessment good postop progress at this time Coflex buddy wrap toes 2 and 3 maintain anklet for compression return in 3 weeks for long-term follow-up x-ray. Maintain ice as needed for L ice and elevation as needed for swelling or pain or discomfort Tylenol as needed for pain next  Alvan Dameichard Emara Lichter DPM

## 2014-11-11 NOTE — Progress Notes (Unsigned)
Dr Blenda Mounts performed a hammertoe repair of 2nd right toe with pin fixation and cheilectomy:exostectomy of arthritic bone spur top 1st right met on 1.27.16

## 2014-11-28 ENCOUNTER — Ambulatory Visit (INDEPENDENT_AMBULATORY_CARE_PROVIDER_SITE_OTHER): Payer: Medicare Other

## 2014-11-28 VITALS — BP 148/87 | HR 75 | Resp 18

## 2014-11-28 DIAGNOSIS — M2021 Hallux rigidus, right foot: Secondary | ICD-10-CM

## 2014-11-28 DIAGNOSIS — M2041 Other hammer toe(s) (acquired), right foot: Secondary | ICD-10-CM

## 2014-11-28 DIAGNOSIS — Z09 Encounter for follow-up examination after completed treatment for conditions other than malignant neoplasm: Secondary | ICD-10-CM

## 2014-11-28 NOTE — Patient Instructions (Signed)
ICE INSTRUCTIONS  Apply ice or cold pack to the affected area at least 3 times a day for 10-15 minutes each time.  You should also use ice after prolonged activity or vigorous exercise.  Do not apply ice longer than 20 minutes at one time.  Always keep a cloth between your skin and the ice pack to prevent burns.  Being consistent and following these instructions will help control your symptoms.  We suggest you purchase a gel ice pack because they are reusable and do bit leak.  Some of them are designed to wrap around the area.  Use the method that works best for you.  Here are some other suggestions for icing.   Use a frozen bag of peas or corn-inexpensive and molds well to your body, usually stays frozen for 10 to 20 minutes.  Wet a towel with cold water and squeeze out the excess until it's damp.  Place in a bag in the freezer for 20 minutes. Then remove and use.  May discontinue boot or surgical shoe and return to walking tennis or athletic shoe. Recommend a lace up and Oxford type shoe consider new balance or Brooks or ASICS running shoes. Also Merrills, Birkenstock type sandals, or even crocs can be beneficial around the house. Avoid very flimsy or thin soled shoes or flip-flops

## 2014-11-28 NOTE — Progress Notes (Signed)
   Subjective:    Patient ID: Patricia [name], female    DOB: 18-Aug-1941, 74 y.o.   MRN: [id number]  HPI  I AM DOING GOOD ON MY RIGHT FOOT AND MY FOOT DOES STILL SWELL   Review of Systems no new findings or systemic changes    Objective:   Physical Exam Patient presents this time five-week status post hammertoe second digit as well as chilectomy and the acne first MTP joint right foot. Patient advised to continue with active passive range of motion exercises this time K wire from second toe right foot is removed Neosporin and Band-Aid dressing Coflex wrap applied maintain buddy wrapping of second and third toes. May discontinue surgical shoe and resume a comfortable walking tennis or athletic shoe. Currently is wearing sketchers which are too soft flimsy and flexible have no structure support. Recommended a much better shoe crocs around the house new balance Brooks or ASICS running shoes. No barefoot or flimsy shoes or flip-flops.       Assessment & Plan:  Assessment good postop progress x-rays revealed good position the osteotomy good consolidation is removed without and it was pain or discomfort at this time Neosporin and Band-Aid dressing applied continue with Coflex wrap of the toes recheck in 2 months for long-term follow-up maintain compression stocking on an as-needed basis if there is any swelling  Alvan Dameichard Wendee Hata DPM

## 2015-01-30 ENCOUNTER — Ambulatory Visit (INDEPENDENT_AMBULATORY_CARE_PROVIDER_SITE_OTHER): Payer: Medicare Other | Admitting: Podiatrist

## 2015-01-30 ENCOUNTER — Encounter: Payer: Self-pay | Admitting: Podiatrist

## 2015-01-30 ENCOUNTER — Ambulatory Visit (INDEPENDENT_AMBULATORY_CARE_PROVIDER_SITE_OTHER): Payer: Medicare Other

## 2015-01-30 VITALS — BP 141/76 | HR 63 | Resp 18

## 2015-01-30 DIAGNOSIS — Z09 Encounter for follow-up examination after completed treatment for conditions other than malignant neoplasm: Secondary | ICD-10-CM

## 2015-01-30 DIAGNOSIS — M2021 Hallux rigidus, right foot: Secondary | ICD-10-CM

## 2015-01-30 DIAGNOSIS — M2041 Other hammer toe(s) (acquired), right foot: Secondary | ICD-10-CM

## 2015-01-30 NOTE — Progress Notes (Signed)
Chief Complaint  Patient presents with  . Routine Post Op    HAD SURGERY ON 10/23/14 ON MY RIGHT FOOT -HAMMERTOE 2ND TOE AND AND CHILECTOMY AND FIRST MTP RIGHT BIG TOE      Objective:   Physical Exam   Patient presents today for follow up on right foot surgery. She relates no pain in the foot. Slight swelling noted to the right 2nd toe which is in good rectus alignment.  Slight tingling noted to the medial portion of the incision.  xrays show a well healing surgical foot. Neurovascular status intact.      Assessment & Plan:  Assessment good postop progress s/p right foot surgery   Plan:  She is released from her post op course. If any concerns arise she will call.  Discussed the toe should decrease the swelling over the next few weeks and it may be 6 mo to a year for the nerve to calm down completely.
